# Patient Record
Sex: Female | Born: 1960
Health system: Southern US, Community
[De-identification: ages and names within clinical notes are randomized; demographics above are authoritative.]

## PROBLEM LIST (undated history)

## (undated) DIAGNOSIS — R928 Other abnormal and inconclusive findings on diagnostic imaging of breast: Secondary | ICD-10-CM

## (undated) DIAGNOSIS — N83299 Other ovarian cyst, unspecified side: Secondary | ICD-10-CM

## (undated) HISTORY — DX: Other abnormal and inconclusive findings on diagnostic imaging of breast: R92.8

## (undated) HISTORY — DX: Other ovarian cyst, unspecified side: N83.299

## (undated) HISTORY — PX: ABDOMINAL HYSTERECTOMY: SHX81

## (undated) HISTORY — PX: APPENDECTOMY: SHX54

## (undated) HISTORY — PX: COLON SURGERY: SHX602

---

## 1998-09-05 ENCOUNTER — Encounter: Payer: Self-pay | Admitting: *Deleted

## 1998-09-05 ENCOUNTER — Ambulatory Visit (HOSPITAL_COMMUNITY): Admission: RE | Admit: 1998-09-05 | Discharge: 1998-09-05 | Payer: Self-pay | Admitting: *Deleted

## 1998-10-23 ENCOUNTER — Other Ambulatory Visit: Admission: RE | Admit: 1998-10-23 | Discharge: 1998-10-23 | Payer: Self-pay | Admitting: *Deleted

## 1999-10-29 ENCOUNTER — Other Ambulatory Visit: Admission: RE | Admit: 1999-10-29 | Discharge: 1999-10-29 | Payer: Self-pay | Admitting: *Deleted

## 2003-11-11 HISTORY — PX: PARTIAL COLECTOMY: SHX5273

## 2003-11-11 HISTORY — PX: DILATION AND CURETTAGE OF UTERUS: SHX78

## 2004-11-10 HISTORY — PX: BREAST ENHANCEMENT SURGERY: SHX7

## 2004-11-10 HISTORY — PX: AUGMENTATION MAMMAPLASTY: SUR837

## 2008-11-10 DIAGNOSIS — N83299 Other ovarian cyst, unspecified side: Secondary | ICD-10-CM

## 2008-11-10 HISTORY — DX: Other ovarian cyst, unspecified side: N83.299

## 2009-07-20 LAB — HM PAP SMEAR: HM Pap smear: NORMAL

## 2009-11-10 HISTORY — PX: SCAR REVISION: SHX5285

## 2011-03-13 ENCOUNTER — Other Ambulatory Visit: Payer: Self-pay | Admitting: Family Medicine

## 2011-07-31 ENCOUNTER — Encounter: Payer: Self-pay | Admitting: Internal Medicine

## 2011-07-31 ENCOUNTER — Ambulatory Visit (INDEPENDENT_AMBULATORY_CARE_PROVIDER_SITE_OTHER): Payer: PRIVATE HEALTH INSURANCE | Admitting: Internal Medicine

## 2011-07-31 VITALS — BP 136/81 | HR 77 | Temp 98.2°F | Resp 16 | Ht 62.0 in | Wt 117.5 lb

## 2011-07-31 DIAGNOSIS — F32A Depression, unspecified: Secondary | ICD-10-CM | POA: Insufficient documentation

## 2011-07-31 DIAGNOSIS — F329 Major depressive disorder, single episode, unspecified: Secondary | ICD-10-CM

## 2011-07-31 DIAGNOSIS — R5381 Other malaise: Secondary | ICD-10-CM

## 2011-07-31 DIAGNOSIS — R5383 Other fatigue: Secondary | ICD-10-CM

## 2011-07-31 MED ORDER — CITALOPRAM HYDROBROMIDE 10 MG PO TABS: 10.0000 mg | ORAL_TABLET | Freq: Every day | ORAL | Status: DC

## 2011-07-31 NOTE — Progress Notes (Signed)
Subjective:    Patient ID: Mikayla Wagner, female    DOB: [birth year], 50 y.o.   MRN: [id number]  HPI Mikayla Wagner is a 50 year old female who presents to establish care. Her primary concern today is increased fatigue, anxiety, and depression which she has noted ever since having her hysterectomy. She reports having an emergent hysterectomy because of her rapidly growing ovarian cyst. The cyst was removed as was part of her omentum. After that time she reports generally not feeling like herself. She was started on estrogen supplementation, but has had no improvement with this. She continues to have fatigue and reports decreased interest in her usual activities. She also reports increased agitation and being quick to anger. She reports some weight gain. She denies any fever, chills, hot flashes. She does have pain during intercourse, associated with vaginal dryness.  Outpatient Encounter Prescriptions as of 07/31/2011  Medication Sig Dispense Refill  . ALPRAZolam (XANAX) 1 MG tablet Take 1 mg by mouth at bedtime as needed.        [name] estrogens conjugated, synthetic B, (ENJUVIA) 1.25 MG tablet Take 1.25 mg by mouth daily. Takes 1/2 tablet       . citalopram (CELEXA) 10 MG tablet Take 1 tablet (10 mg total) by mouth daily.  30 tablet  2    Review of Systems  Constitutional: Positive for fatigue. Negative for fever, chills, appetite change and unexpected weight change.  HENT: Negative for ear pain, congestion, sore throat, trouble swallowing, neck pain, voice change and sinus pressure.   Eyes: Negative for visual disturbance.  Respiratory: Negative for cough, shortness of breath, wheezing and stridor.   Cardiovascular: Negative for chest pain, palpitations and leg swelling.  Gastrointestinal: Negative for nausea, vomiting, abdominal pain, diarrhea, constipation, blood in stool, abdominal distention and anal bleeding.  Genitourinary: Positive for dyspareunia. Negative for dysuria and flank pain.    Musculoskeletal: Negative for myalgias, arthralgias and gait problem.  Skin: Negative for color change and rash.  Neurological: Negative for dizziness and headaches.  Hematological: Negative for adenopathy. Does not bruise/bleed easily.  Psychiatric/Behavioral: Positive for dysphoric mood and agitation. Negative for suicidal ideas and sleep disturbance. The patient is nervous/anxious.    BP 136/81  Pulse 77  Temp(Src) 98.2 F (36.8 C) (Oral)  Resp 16  Ht 5\' 2"  (1.575 m)  Wt 117 lb 8 oz (53.298 kg)  BMI 21.49 kg/m2  SpO2 100%     Objective:   Physical Exam  Constitutional: She is oriented to person, place, and time. She appears well-developed and well-nourished. No distress.  HENT:  Head: Normocephalic and atraumatic.  Right Ear: External ear normal.  Left Ear: External ear normal.  Nose: Nose normal.  Mouth/Throat: Oropharynx is clear and moist. No oropharyngeal exudate.  Eyes: Conjunctivae are normal. Pupils are equal, round, and reactive to light. Right eye exhibits no discharge. Left eye exhibits no discharge. No scleral icterus.  Neck: Normal range of motion. Neck supple. No tracheal deviation present. No thyromegaly present.  Cardiovascular: Normal rate, regular rhythm, normal heart sounds and intact distal pulses.  Exam reveals no gallop and no friction rub.   No murmur heard. Pulmonary/Chest: Effort normal and breath sounds normal. No respiratory distress. She has no wheezes. She has no rales. She exhibits no tenderness.  Abdominal: Soft. Bowel sounds are normal. She exhibits no distension and no mass. There is no tenderness. There is no rebound and no guarding.  Musculoskeletal: Normal range of motion. She exhibits no edema and no tenderness.  Lymphadenopathy:    She has no cervical adenopathy.  Neurological: She is alert and oriented to person, place, and time. No cranial nerve deficit. She exhibits normal muscle tone. Coordination normal.  Skin: Skin is warm and dry.  No rash noted. She is not diaphoretic. No erythema. No pallor.  Psychiatric: She has a normal mood and affect. Her behavior is normal. Judgment and thought content normal.          Assessment & Plan:  1. Fatigue - patient with fatigue ever since having a hysterectomy. Review of labs obtained in May of 2012 show normal blood counts, kidney function, and liver function. I suspect that her fatigue may be related to estrogen deficiency. However, she has been taking estrogen supplementation. Question if she might benefit from an alternative form of estrogen supplementation either Premarin or bioidentical hormones. We discussed there is no medical data to support the use of bioidentical hormones, however for patients who has had difficulty with traditional preparations sometimes there is a benefit. On her labs from May in 2012 her thyroid function was normal, however we will plan to repeat it with labs today. We'll also check a B12 level given her history of bowel resection. She will followup in one month.  2. Depression - patient reports symptoms consistent with depression. We discussed starting Celexa to see if any improvement. Will start Celexa 10 mg daily. She will followup in one month.

## 2011-08-01 ENCOUNTER — Other Ambulatory Visit: Payer: Self-pay | Admitting: Internal Medicine

## 2011-08-18 ENCOUNTER — Telehealth: Payer: Self-pay | Admitting: Internal Medicine

## 2011-08-18 NOTE — Telephone Encounter (Signed)
Can you fine out where labs done? I don't see any in system. If wants to discuss meds, then needs visit.

## 2011-08-18 NOTE — Telephone Encounter (Signed)
OK. Let's get notes from hospital and have her change celexa to morning to see if any improvement.

## 2011-08-18 NOTE — Telephone Encounter (Signed)
Wanting lab results patient is at home. Wants to discuss new medication.

## 2011-08-18 NOTE — Telephone Encounter (Signed)
Patient says that these labs were ordered at her new patient visit on 9-20 and she had them done at the hospital. I will call and see if I can have them sent over to [place]. Also patient says that she was told to call if the celexa wasn't working for her. She says that it is helping, but seems to be causing abdominal cramps. She has been taking at night before bedtime. She is asking if she should start taking it in the mornings. Please advise.

## 2011-08-18 NOTE — Telephone Encounter (Signed)
I sent request to [facility] for recent lab results. Patient notified to try taking celexa in the am and call if there is no improvement.

## 2011-08-21 ENCOUNTER — Telehealth: Payer: Self-pay | Admitting: Internal Medicine

## 2011-08-21 NOTE — Telephone Encounter (Signed)
Patients wants her lab results from the hospital . Lab work was faxed over by the hospital according to the patient.

## 2011-08-21 NOTE — Telephone Encounter (Signed)
Pam, please look into this. I think I sent you a message on this.

## 2011-08-21 NOTE — Telephone Encounter (Signed)
Pam, please look into this

## 2011-08-22 ENCOUNTER — Telehealth: Payer: Self-pay | Admitting: Internal Medicine

## 2011-08-22 NOTE — Telephone Encounter (Signed)
Faxed over to medical records to fax [place] her labs.

## 2011-08-27 ENCOUNTER — Telehealth: Payer: Self-pay | Admitting: Internal Medicine

## 2011-08-27 NOTE — Telephone Encounter (Signed)
Left patient VM to return my call.  

## 2011-08-27 NOTE — Telephone Encounter (Signed)
OPened in error.

## 2011-08-27 NOTE — Telephone Encounter (Signed)
Message copied by Virgina Evener on Wed Aug 27, 2011 12:50 PM ------      Message from: Ronna Polio A      Created: Fri Aug 22, 2011 10:11 AM      Regarding: Labs       I was able to get labs from the hospital. Blood counts and thyroid function were normal. B12 was normal. Hormone levels were normal except testosterone which was on higher side of normal. Vit D was slightly low, and I would recommend 1000units of vit d daily.  We can review in detail at her visit.

## 2011-09-01 ENCOUNTER — Encounter: Payer: Self-pay | Admitting: Internal Medicine

## 2011-09-01 ENCOUNTER — Ambulatory Visit (INDEPENDENT_AMBULATORY_CARE_PROVIDER_SITE_OTHER): Payer: PRIVATE HEALTH INSURANCE | Admitting: Internal Medicine

## 2011-09-01 DIAGNOSIS — F329 Major depressive disorder, single episode, unspecified: Secondary | ICD-10-CM

## 2011-09-01 DIAGNOSIS — F32A Depression, unspecified: Secondary | ICD-10-CM

## 2011-09-01 DIAGNOSIS — G4700 Insomnia, unspecified: Secondary | ICD-10-CM

## 2011-09-01 MED ORDER — ERGOCALCIFEROL 1.25 MG (50000 UT) PO CAPS: 50000.0000 [IU] | ORAL_CAPSULE | ORAL | Status: AC

## 2011-09-01 MED ORDER — CITALOPRAM HYDROBROMIDE 20 MG PO TABS: 20.0000 mg | ORAL_TABLET | Freq: Every day | ORAL | Status: DC

## 2011-09-01 MED ORDER — ALPRAZOLAM 1 MG PO TABS: 1.0000 mg | ORAL_TABLET | Freq: Every evening | ORAL | Status: DC | PRN

## 2011-09-01 NOTE — Progress Notes (Signed)
  Subjective:    Patient ID: Mikayla Wagner, female    DOB: Dec 25, 1960, 50 y.o.   MRN: [id number]  HPI Ms. Mikayla Wagner is a 50 year old female with a history of depression and recent fatigue who presents for followup. At her last visit she was started on Celexa. She reports initially she had some improvement in her depressive symptoms, however then symptoms recurred.  She also has severe fatigue. Recent lab work was unremarkable. She had normal blood counts, normal kidney and liver function, normal thyroid function, normal B12 level. Her vitamin D was slightly low. Hormone levels were remarkable for elevated testosterone, however patient is using topical testosterone preparation. Estrogen level was normal. Progesterone level was slightly low.   Outpatient Encounter Prescriptions as of 09/01/2011  Medication Sig Dispense Refill  . ALPRAZolam (XANAX) 1 MG tablet Take 1 tablet (1 mg total) by mouth at bedtime as needed.  30 tablet  4  . estrogens conjugated, synthetic B, (ENJUVIA) 1.25 MG tablet Take 1.25 mg by mouth daily. Takes 1/2 tablet       . ibuprofen (ADVIL,MOTRIN) 800 MG tablet Take 800 mg by mouth every 8 (eight) hours as needed.        [name] DISCONTD: citalopram (CELEXA) 10 MG tablet Take 1 tablet (10 mg total) by mouth daily.  30 tablet  2    Review of Systems  Constitutional: Positive for fatigue. Negative for fever and chills.  Psychiatric/Behavioral: Positive for dysphoric mood. The patient is nervous/anxious.    BP 128/86  Pulse 85  Temp(Src) 98.5 F (36.9 C) (Oral)  Resp 16  Ht 5\' 2"  (1.575 m)  Wt 117 lb 4 oz (53.184 kg)  BMI 21.45 kg/m2  SpO2 97%     Objective:   Physical Exam  Constitutional: She appears well-developed and well-nourished.  HENT:  Head: Normocephalic.  Pulmonary/Chest: Effort normal.  Psychiatric: Her speech is normal. Cognition and memory are normal. She exhibits a depressed mood.          Assessment & Plan:  1. Depression - symptoms initially  improved with Celexa, then recurred. Will try increasing dose of Celexa to 20 mg daily. Patient will followup in one month. Encouraged her to consider counseling, however she would prefer to defer this at this time.  2. Fatigue -likely multifactorial. I suspect that depression is playing a major role. I don't think that vitamin D deficiency is significant enough to be playing a role, however we will supplement this to see if any improvement. It is possible that hormone imbalance is playing a role, and she has followup with pharmacist for evaluation for hormone compounding. I have also encouraged her to start a multivitamin with iron as MCV on low end of normal which may reflect iron deficiency. We'll plan to followup in one month. She will have CBC, Ferritin, and Vit D repeated prior to that visit.

## 2011-09-01 NOTE — Patient Instructions (Signed)
1. Increase celexa to 20mg  daily. 2. Add Vit D (Drisdol) 50000units weekly x4 weeks 3. Follow up with Annice Pih 4. Add multivitamin with iron 5. Continue xanax as needed for sleep 6. Labs in 3weeks 7. Follow up in 4 weeks.

## 2011-09-10 ENCOUNTER — Telehealth: Payer: Self-pay | Admitting: Internal Medicine

## 2011-09-10 NOTE — Telephone Encounter (Signed)
Pam, Didn't you call her with these lab reports?Can you call her again and see what she needs?

## 2011-09-10 NOTE — Telephone Encounter (Signed)
Patient can't get her lab work from [facility] . They told her the physician has to request it, she is wanting her test results from 9.21.12.

## 2011-09-12 NOTE — Telephone Encounter (Signed)
MD emailed patient. Email's printed and sent to be scanned.

## 2011-09-17 ENCOUNTER — Encounter: Payer: Self-pay | Admitting: Internal Medicine

## 2011-10-06 ENCOUNTER — Ambulatory Visit: Payer: PRIVATE HEALTH INSURANCE | Admitting: Internal Medicine

## 2011-10-07 ENCOUNTER — Telehealth: Payer: Self-pay | Admitting: *Deleted

## 2011-10-07 MED ORDER — ESTRIOL POWD: Status: DC

## 2011-10-07 MED ORDER — PROGESTERONE POWD: Status: DC

## 2011-10-07 NOTE — Telephone Encounter (Signed)
[facility] @ [place] faxed MD results of hormone testing. They suggested RX for cream and vaginal tablet, ok per MD, order faxed.

## 2011-11-06 ENCOUNTER — Ambulatory Visit: Payer: PRIVATE HEALTH INSURANCE | Admitting: Internal Medicine

## 2012-02-16 ENCOUNTER — Other Ambulatory Visit: Payer: Self-pay | Admitting: Internal Medicine

## 2012-02-16 ENCOUNTER — Ambulatory Visit (INDEPENDENT_AMBULATORY_CARE_PROVIDER_SITE_OTHER): Payer: PRIVATE HEALTH INSURANCE | Admitting: Internal Medicine

## 2012-02-16 ENCOUNTER — Encounter: Payer: Self-pay | Admitting: Internal Medicine

## 2012-02-16 VITALS — BP 118/76 | HR 66 | Temp 98.6°F | Wt 127.0 lb

## 2012-02-16 DIAGNOSIS — R140 Abdominal distension (gaseous): Secondary | ICD-10-CM | POA: Insufficient documentation

## 2012-02-16 DIAGNOSIS — R5383 Other fatigue: Secondary | ICD-10-CM

## 2012-02-16 DIAGNOSIS — R5381 Other malaise: Secondary | ICD-10-CM

## 2012-02-16 DIAGNOSIS — R141 Gas pain: Secondary | ICD-10-CM

## 2012-02-16 LAB — COMPREHENSIVE METABOLIC PANEL
Alkaline Phosphatase: 59 U/L (ref 50–136)
Calcium, Total: 8.9 mg/dL (ref 8.5–10.1)
Co2: 28 mmol/L (ref 21–32)
EGFR (Non-African Amer.): >60
Osmolality: 276 (ref 275–301)
Potassium: 4.2 mmol/L (ref 3.5–5.1)
SGPT (ALT): 25 U/L
Sodium: 139 mmol/L (ref 136–145)
Total Protein: 7.5 g/dL (ref 6.4–8.2)

## 2012-02-16 LAB — CBC WITH DIFFERENTIAL/PLATELET
Basophil #: 0.1 10*3/uL (ref 0.0–0.1)
Eosinophil #: 0.2 10*3/uL (ref 0.0–0.7)
Eosinophil %: 1.6 %
HCT: 43.5 % (ref 35.0–47.0)
Lymphocyte #: 1.5 10*3/uL (ref 1.0–3.6)
Lymphocyte %: 15.3 %
MCH: 28.1 pg (ref 26.0–34.0)
MCV: 86 fL (ref 80–100)
Monocyte #: 0.4 10*3/uL (ref 0.0–0.7)
Monocyte %: 4 %
Neutrophil #: 7.6 10*3/uL — ABNORMAL HIGH (ref 1.4–6.5)
Neutrophil %: 78.5 %
Platelet: 305 10*3/uL (ref 150–440)
RBC: 5.03 10*6/uL (ref 3.80–5.20)
RDW: 13.1 % (ref 11.5–14.5)

## 2012-02-16 LAB — TSH: Thyroid Stimulating Horm: 1.67 u[IU]/mL

## 2012-02-16 NOTE — Progress Notes (Signed)
Subjective:    Patient ID: Mikayla Wagner, female    DOB: 1961/05/03, 51 y.o.   MRN: [id number]  HPI 51 year old female with history of depression and menopausal disorder presents for followup. He is concerned today about progressive fatigue. She noted fatigue at her last visit and lab work including CBC and thyroid function were normal. We suspected that the fatigue might be related to hot flashes and menopause and she started on hormone replacement. She reports no improvement with this. She continues to have hot flashes at night which impair her sleep. She is also noted some abdominal distention. She notes that she has gained approximately 6-7 pounds since her last visit. She denies any abdominal pain, nausea, vomiting, diarrhea, constipation, or change in bowel habits. She reports that she does not always follow a healthy diet and she does not exercise regularly.  She is concerned about the fatigue and weight gain and questions whether stimulant such as Vyvanse or phentermine might be helpful for her. She has never been diagnosed with ADD.  Outpatient Encounter Prescriptions as of 02/16/2012  Medication Sig Dispense Refill  . ALPRAZolam (XANAX) 1 MG tablet Take 1 tablet (1 mg total) by mouth at bedtime as needed.  30 tablet  4  . ergocalciferol (DRISDOL) 50000 UNITS capsule Take 1 capsule (50,000 Units total) by mouth once a week.  4 capsule  3  . Estriol POWD 1 mg vaginal tablet w/Testosterone 1 mg, USE one PV at bed time two times a week  15 Bottle  11  . estrogens conjugated, synthetic B, (ENJUVIA) 1.25 MG tablet Take 1.25 mg by mouth daily. Takes 1/2 tablet       . ibuprofen (ADVIL,MOTRIN) 800 MG tablet Take 800 mg by mouth every 8 (eight) hours as needed.        . Progesterone POWD 40 mg/mL Apply 1/2 mL twice daily 25 days a month  30 Bottle  11  . citalopram (CELEXA) 20 MG tablet Take 1 tablet (20 mg total) by mouth daily.  30 tablet  2    Review of Systems  Constitutional: Positive for  diaphoresis and fatigue. Negative for fever, chills, appetite change and unexpected weight change.  HENT: Negative for ear pain, congestion, sore throat, trouble swallowing, neck pain, voice change and sinus pressure.   Eyes: Negative for visual disturbance.  Respiratory: Negative for cough, shortness of breath, wheezing and stridor.   Cardiovascular: Negative for chest pain, palpitations and leg swelling.  Gastrointestinal: Positive for abdominal distention. Negative for nausea, vomiting, abdominal pain, diarrhea, constipation, blood in stool and anal bleeding.  Genitourinary: Negative for dysuria and flank pain.  Musculoskeletal: Negative for myalgias, arthralgias and gait problem.  Skin: Negative for color change and rash.  Neurological: Negative for dizziness and headaches.  Hematological: Negative for adenopathy. Does not bruise/bleed easily.  Psychiatric/Behavioral: Positive for sleep disturbance. Negative for suicidal ideas and dysphoric mood. The patient is not nervous/anxious.    BP 118/76  Pulse 66  Temp(Src) 98.6 F (37 C) (Oral)  Wt 127 lb (57.607 kg)  SpO2 99%     Objective:   Physical Exam  Constitutional: She is oriented to person, place, and time. She appears well-developed and well-nourished. No distress.  HENT:  Head: Normocephalic and atraumatic.  Right Ear: External ear normal.  Left Ear: External ear normal.  Nose: Nose normal.  Mouth/Throat: Oropharynx is clear and moist. No oropharyngeal exudate.  Eyes: Conjunctivae are normal. Pupils are equal, round, and reactive to light. Right eye  exhibits no discharge. Left eye exhibits no discharge. No scleral icterus.  Neck: Normal range of motion. Neck supple. No tracheal deviation present. No thyromegaly present.  Cardiovascular: Normal rate, regular rhythm, normal heart sounds and intact distal pulses.  Exam reveals no gallop and no friction rub.   No murmur heard. Pulmonary/Chest: Effort normal and breath sounds  normal. No respiratory distress. She has no wheezes. She has no rales. She exhibits no tenderness.  Abdominal: Soft. Bowel sounds are normal. She exhibits no distension. There is no tenderness.  Musculoskeletal: Normal range of motion. She exhibits no edema and no tenderness.  Lymphadenopathy:    She has no cervical adenopathy.  Neurological: She is alert and oriented to person, place, and time. No cranial nerve deficit. She exhibits normal muscle tone. Coordination normal.  Skin: Skin is warm and dry. No rash noted. She is not diaphoretic. No erythema. No pallor.  Psychiatric: She has a normal mood and affect. Her behavior is normal. Judgment and thought content normal.          Assessment & Plan:

## 2012-02-16 NOTE — Assessment & Plan Note (Addendum)
Pt continues to have fatigue. She questions possible use of medication such as Vyvanse or Phentermine, but we discussed that these medications are not indicated as she has no h/o ADHD or obesity. Suspect fatigue related to ongoing hot flashes at night, limiting sleep. Will recheck TSH, Free T4 and T3. Will check CBC. Follow up 1 month.

## 2012-02-16 NOTE — Assessment & Plan Note (Signed)
Pt c/o abdominal girth increase and 6-7lb weight gain. Exam is normal. Suspect weight gain from increased caloric intake and lack of exercise, however given pt h/o ovarian cyst removal will get ultrasound abdomen and pelvis.

## 2012-02-17 ENCOUNTER — Telehealth: Payer: Self-pay | Admitting: Internal Medicine

## 2012-02-17 NOTE — Telephone Encounter (Signed)
Estradiol <5.1 Progesterone 0.1

## 2012-02-19 ENCOUNTER — Other Ambulatory Visit: Payer: Self-pay | Admitting: Internal Medicine

## 2012-02-19 DIAGNOSIS — R141 Gas pain: Secondary | ICD-10-CM

## 2012-02-20 ENCOUNTER — Encounter: Payer: Self-pay | Admitting: Internal Medicine

## 2012-02-20 ENCOUNTER — Ambulatory Visit: Payer: Self-pay | Admitting: Internal Medicine

## 2012-02-23 ENCOUNTER — Telehealth: Payer: Self-pay | Admitting: Internal Medicine

## 2012-02-23 NOTE — Telephone Encounter (Signed)
I called patient to give her the appointment for her US Pelvis and she states that she has already had this done.  She stated "maybe one day we would get it together here and know what is going on". I tried to explain to patient that I was sorry I was working the referral wq and yes I had noticed that she had an US Abdomen.  She said whatever good bye and hung up.

## 2012-02-24 ENCOUNTER — Telehealth: Payer: Self-pay | Admitting: Internal Medicine

## 2012-02-24 NOTE — Telephone Encounter (Signed)
Pelvic US was normal 

## 2012-02-25 ENCOUNTER — Encounter: Payer: Self-pay | Admitting: Internal Medicine

## 2012-02-26 ENCOUNTER — Encounter: Payer: Self-pay | Admitting: Internal Medicine

## 2012-03-03 ENCOUNTER — Encounter: Payer: Self-pay | Admitting: Internal Medicine

## 2012-05-07 ENCOUNTER — Other Ambulatory Visit: Payer: Self-pay | Admitting: *Deleted

## 2012-05-07 DIAGNOSIS — G4700 Insomnia, unspecified: Secondary | ICD-10-CM

## 2012-05-07 MED ORDER — ALPRAZOLAM 1 MG PO TABS: 1.0000 mg | ORAL_TABLET | Freq: Every evening | ORAL | Status: DC | PRN

## 2012-05-07 NOTE — Telephone Encounter (Signed)
Rx called to [facility] pharmacy. 

## 2012-05-16 LAB — HM PAP SMEAR

## 2012-06-17 ENCOUNTER — Encounter: Payer: Self-pay | Admitting: Internal Medicine

## 2012-06-17 ENCOUNTER — Other Ambulatory Visit (HOSPITAL_COMMUNITY)
Admission: RE | Admit: 2012-06-17 | Discharge: 2012-06-17 | Disposition: A | Discharge status: Home / Self Care | Payer: PRIVATE HEALTH INSURANCE | Source: Ambulatory Visit | Attending: Internal Medicine | Admitting: Internal Medicine

## 2012-06-17 ENCOUNTER — Ambulatory Visit (INDEPENDENT_AMBULATORY_CARE_PROVIDER_SITE_OTHER): Payer: PRIVATE HEALTH INSURANCE | Admitting: Internal Medicine

## 2012-06-17 VITALS — BP 110/60 | HR 72 | Temp 98.7°F | Ht 62.0 in | Wt 130.5 lb

## 2012-06-17 DIAGNOSIS — R87810 Cervical high risk human papillomavirus (HPV) DNA test positive: Secondary | ICD-10-CM | POA: Insufficient documentation

## 2012-06-17 DIAGNOSIS — Z01419 Encounter for gynecological examination (general) (routine) without abnormal findings: Secondary | ICD-10-CM | POA: Insufficient documentation

## 2012-06-17 DIAGNOSIS — Z0000 Encounter for general adult medical examination without abnormal findings: Secondary | ICD-10-CM

## 2012-06-17 MED ORDER — CYANOCOBALAMIN 1000 MCG/ML IJ SOLN: 1000.0000 ug | Freq: Once | INTRAMUSCULAR | Status: DC

## 2012-06-17 NOTE — Progress Notes (Signed)
Subjective:    Patient ID: Mikayla Wagner, female    DOB: [birth year], 51 y.o.   MRN: [id number]  HPI 51 year old female presents for annual exam. She reports she is generally doing well. She is concerned about weight gain, and reports that she has been limiting her caloric intake and increasing physical activity in an effort to lose weight, particularly around her abdomen. She denies any other new concerns today.  Outpatient Encounter Prescriptions as of 06/17/2012  Medication Sig Dispense Refill  . ALPRAZolam (XANAX) 1 MG tablet Take 1 tablet (1 mg total) by mouth at bedtime as needed.  30 tablet  3  . ergocalciferol (DRISDOL) 50000 UNITS capsule Take 1 capsule (50,000 Units total) by mouth once a week.  4 capsule  3  . Estriol POWD 1 mg vaginal tablet w/Testosterone 1 mg, USE one PV at bed time two times a week  15 Bottle  11  . Progesterone POWD 40 mg/mL Apply 1/2 mL twice daily 25 days a month  30 Bottle  11  . ibuprofen (ADVIL,MOTRIN) 800 MG tablet Take 800 mg by mouth every 8 (eight) hours as needed.         BP 110/60  Pulse 72  Temp 98.7 F (37.1 C) (Oral)  Ht 5\' 2"  (1.575 m)  Wt 130 lb 8 oz (59.194 kg)  BMI 23.87 kg/m2  SpO2 97%  Review of Systems  Constitutional: Negative for fever, chills, appetite change, fatigue and unexpected weight change.  HENT: Negative for ear pain, congestion, sore throat, trouble swallowing, neck pain, voice change and sinus pressure.   Eyes: Negative for visual disturbance.  Respiratory: Negative for cough, shortness of breath, wheezing and stridor.   Cardiovascular: Negative for chest pain, palpitations and leg swelling.  Gastrointestinal: Negative for nausea, vomiting, abdominal pain, diarrhea, constipation, blood in stool, abdominal distention and anal bleeding.  Genitourinary: Negative for dysuria and flank pain.  Musculoskeletal: Negative for myalgias, arthralgias and gait problem.  Skin: Negative for color change and rash.  Neurological:  Negative for dizziness and headaches.  Hematological: Negative for adenopathy. Does not bruise/bleed easily.  Psychiatric/Behavioral: Negative for suicidal ideas, disturbed wake/sleep cycle and dysphoric mood. The patient is not nervous/anxious.        Objective:   Physical Exam  Constitutional: She is oriented to person, place, and time. She appears well-developed and well-nourished. No distress.  HENT:  Head: Normocephalic and atraumatic.  Right Ear: External ear normal.  Left Ear: External ear normal.  Nose: Nose normal.  Mouth/Throat: Oropharynx is clear and moist. No oropharyngeal exudate.  Eyes: Conjunctivae are normal. Pupils are equal, round, and reactive to light. Right eye exhibits no discharge. Left eye exhibits no discharge. No scleral icterus.  Neck: Normal range of motion. Neck supple. No tracheal deviation present. No thyromegaly present.  Cardiovascular: Normal rate, regular rhythm, normal heart sounds and intact distal pulses.  Exam reveals no gallop and no friction rub.   No murmur heard. Pulmonary/Chest: Effort normal and breath sounds normal. No respiratory distress. She has no wheezes. She has no rales. She exhibits no tenderness.       Implants bilateral  Abdominal: Soft. Bowel sounds are normal. She exhibits no distension and no mass. There is no tenderness. There is no rebound and no guarding.  Genitourinary: Rectum normal, vagina normal and uterus normal. No breast swelling, tenderness, discharge or bleeding. Pelvic exam was performed with patient supine. There is no rash, tenderness or lesion on the right labia. There is no  rash, tenderness or lesion on the left labia. Right adnexum displays no mass, no tenderness and no fullness. Left adnexum displays no mass, no tenderness and no fullness. No erythema or tenderness around the vagina. No vaginal discharge found.       Cervix and uterus surgically absent  Musculoskeletal: Normal range of motion. She exhibits no edema  and no tenderness.  Lymphadenopathy:    She has no cervical adenopathy.  Neurological: She is alert and oriented to person, place, and time. No cranial nerve deficit. She exhibits normal muscle tone. Coordination normal.  Skin: Skin is warm and dry. No rash noted. She is not diaphoretic. No erythema. No pallor.  Psychiatric: She has a normal mood and affect. Her behavior is normal. Judgment and thought content normal.          Assessment & Plan:

## 2012-06-17 NOTE — Addendum Note (Signed)
Addended by: Jobie Quaker on: 06/17/2012 02:29 PM   Modules accepted: Orders

## 2012-06-17 NOTE — Assessment & Plan Note (Signed)
General medical exam including breast and pelvic exam are normal today. Pap is pending. Mammogram has been ordered. Will check basic blood work including CBC, CMP, lipid profile with labs drawn at [facility].

## 2012-06-21 ENCOUNTER — Telehealth: Payer: Self-pay | Admitting: *Deleted

## 2012-06-21 NOTE — Telephone Encounter (Signed)
Patient called and left a message on voicemail for me to call her, called back on work number and left message on voicemail for her to return my call.  She had questions about testosterone labs and dexa scan.

## 2012-06-22 ENCOUNTER — Other Ambulatory Visit: Payer: Self-pay | Admitting: Internal Medicine

## 2012-06-22 DIAGNOSIS — M810 Age-related osteoporosis without current pathological fracture: Secondary | ICD-10-CM

## 2012-06-22 NOTE — Telephone Encounter (Signed)
Order for Dexa faxed to [facility], order to add testosterone faxed to patient per her request.

## 2012-06-22 NOTE — Telephone Encounter (Signed)
Patient would like order for dexa scan faxed to [facility] and she also wants a testosterone level added to her other lab work.  Forms given to Dr. Dan Humphreys to order dexa scan and to add testosterone level.

## 2012-06-23 ENCOUNTER — Other Ambulatory Visit: Payer: Self-pay | Admitting: Internal Medicine

## 2012-06-23 DIAGNOSIS — N870 Mild cervical dysplasia: Secondary | ICD-10-CM

## 2012-06-24 ENCOUNTER — Other Ambulatory Visit: Payer: Self-pay | Admitting: Internal Medicine

## 2012-06-24 ENCOUNTER — Telehealth: Payer: Self-pay | Admitting: Internal Medicine

## 2012-06-24 ENCOUNTER — Encounter: Payer: Self-pay | Admitting: Internal Medicine

## 2012-06-24 LAB — CBC WITH DIFFERENTIAL/PLATELET
Basophil #: 0.1 10*3/uL (ref 0.0–0.1)
Basophil %: 1 %
Eosinophil #: 0.3 10*3/uL (ref 0.0–0.7)
Eosinophil %: 4.6 %
HGB: 13 g/dL (ref 12.0–16.0)
MCH: 27.3 pg (ref 26.0–34.0)
MCHC: 32.1 g/dL (ref 32.0–36.0)
MCV: 85 fL (ref 80–100)
Monocyte #: 0.6 x10 3/mm (ref 0.2–0.9)
Neutrophil #: 4.8 10*3/uL (ref 1.4–6.5)
Neutrophil %: 65.2 %
RDW: 13.6 % (ref 11.5–14.5)

## 2012-06-24 LAB — LIPID PANEL
Cholesterol: 181 mg/dL (ref 0–200)
HDL Cholesterol: 76 mg/dL — ABNORMAL HIGH (ref 40–60)
Ldl Cholesterol, Calc: 93 mg/dL (ref 0–100)
Triglycerides: 58 mg/dL (ref 0–200)

## 2012-06-24 LAB — COMPREHENSIVE METABOLIC PANEL
Albumin: 3.6 g/dL (ref 3.4–5.0)
Alkaline Phosphatase: 65 U/L (ref 50–136)
Anion Gap: 9 (ref 7–16)
BUN: 13 mg/dL (ref 7–18)
Bilirubin,Total: 0.6 mg/dL (ref 0.2–1.0)
Chloride: 104 mmol/L (ref 98–107)
Creatinine: 0.58 mg/dL — ABNORMAL LOW (ref 0.60–1.30)
EGFR (African American): >60
EGFR (Non-African Amer.): >60
Glucose: 89 mg/dL (ref 65–99)
SGOT(AST): 16 U/L (ref 15–37)
SGPT (ALT): 19 U/L (ref 12–78)
Total Protein: 7 g/dL (ref 6.4–8.2)

## 2012-06-24 NOTE — Telephone Encounter (Signed)
Recent labs including blood counts, kidney and liver function were normal.

## 2012-06-30 ENCOUNTER — Encounter: Payer: Self-pay | Admitting: Internal Medicine

## 2012-07-01 ENCOUNTER — Encounter: Payer: Self-pay | Admitting: Internal Medicine

## 2012-07-02 ENCOUNTER — Encounter: Payer: Self-pay | Admitting: Internal Medicine

## 2012-07-09 ENCOUNTER — Encounter: Payer: Self-pay | Admitting: Internal Medicine

## 2012-07-09 ENCOUNTER — Other Ambulatory Visit: Payer: Self-pay | Admitting: Internal Medicine

## 2012-07-09 NOTE — Telephone Encounter (Signed)
Pt called stated that jackie @ [facility] is faxing rx for her hormone meds   Today is the 2nd time this was faxed over on Monday of this week Please fax back to [place] ASAP

## 2012-07-09 NOTE — Telephone Encounter (Signed)
Rx faxed to [facility] pharmacy.  Patient advised via message left on voicemail at work.

## 2012-07-20 ENCOUNTER — Ambulatory Visit: Payer: Self-pay | Admitting: Gynecologic Oncology

## 2012-08-10 ENCOUNTER — Ambulatory Visit: Payer: Self-pay | Admitting: Gynecologic Oncology

## 2012-08-30 DIAGNOSIS — E559 Vitamin D deficiency, unspecified: Secondary | ICD-10-CM | POA: Insufficient documentation

## 2012-09-08 ENCOUNTER — Encounter: Payer: Self-pay | Admitting: Internal Medicine

## 2012-09-08 ENCOUNTER — Other Ambulatory Visit: Payer: Self-pay | Admitting: Internal Medicine

## 2012-09-08 NOTE — Telephone Encounter (Signed)
Refill request for estradiol 0.5 mg tabs Sig: 1/2 to 1 tablet daily  Progesterone sr 50 mg  Sig: take 1-2 caps HS  Testosterone 2% in petrolatum  Sig: uud

## 2012-09-09 NOTE — Telephone Encounter (Signed)
I already signed these to be filled at [facility]. They were in fax pile.

## 2012-09-09 NOTE — Telephone Encounter (Signed)
Rx faxed to [facility] pharmacy 

## 2012-10-21 ENCOUNTER — Ambulatory Visit: Payer: Self-pay | Admitting: Internal Medicine

## 2012-10-25 ENCOUNTER — Telehealth: Payer: Self-pay | Admitting: Internal Medicine

## 2012-10-25 NOTE — Telephone Encounter (Signed)
My Chart message sent to pt. Your bone density test showed T-score -2.3. This is consistent with early weakening of the bones. We should set up a visit to discuss treatment options.

## 2012-11-01 ENCOUNTER — Ambulatory Visit: Payer: PRIVATE HEALTH INSURANCE | Admitting: Internal Medicine

## 2012-11-01 ENCOUNTER — Ambulatory Visit: Payer: Self-pay | Admitting: Internal Medicine

## 2012-11-04 ENCOUNTER — Telehealth: Payer: Self-pay | Admitting: Internal Medicine

## 2012-11-04 ENCOUNTER — Encounter: Payer: Self-pay | Admitting: Internal Medicine

## 2012-11-04 NOTE — Telephone Encounter (Signed)
Mammograms showed microcalcifications left breast.  Recommended compression views. Has this been set up?

## 2012-11-05 ENCOUNTER — Ambulatory Visit: Payer: Self-pay | Admitting: Internal Medicine

## 2012-11-05 ENCOUNTER — Ambulatory Visit: Payer: PRIVATE HEALTH INSURANCE | Admitting: Internal Medicine

## 2012-11-05 ENCOUNTER — Telehealth: Payer: Self-pay | Admitting: Internal Medicine

## 2012-11-05 DIAGNOSIS — R920 Mammographic microcalcification found on diagnostic imaging of breast: Secondary | ICD-10-CM

## 2012-11-05 NOTE — Telephone Encounter (Signed)
Mammogram BIRADS4 - microcalcifications left breast, will set up surgical eval

## 2012-11-05 NOTE — Telephone Encounter (Signed)
Yes, pt had completed on 12/27.

## 2012-11-08 ENCOUNTER — Encounter: Payer: Self-pay | Admitting: Internal Medicine

## 2012-11-08 ENCOUNTER — Telehealth: Payer: Self-pay | Admitting: Emergency Medicine

## 2012-11-08 ENCOUNTER — Telehealth: Payer: Self-pay | Admitting: Internal Medicine

## 2012-11-08 DIAGNOSIS — G4700 Insomnia, unspecified: Secondary | ICD-10-CM

## 2012-11-08 MED ORDER — ALPRAZOLAM 1 MG PO TABS: 1.0000 mg | ORAL_TABLET | Freq: Every evening | ORAL | Status: DC | PRN

## 2012-11-08 NOTE — Telephone Encounter (Signed)
Patient is wanting to know if we can get her xanax refilled. She is quite upset about having to go to Dr. Rutherford Nail office for her abnormal mammo. Could we please refill this and let her know when we do?

## 2012-11-08 NOTE — Telephone Encounter (Signed)
Ok to refill 

## 2012-11-08 NOTE — Telephone Encounter (Signed)
Patient calling to see if her prescription was faxed and when?  The pharmacy had to close early.   Per chart, it was refilled and sent with 3 refills.

## 2012-11-08 NOTE — Telephone Encounter (Signed)
I faxed it twice and ended up calling them Verbally.

## 2012-11-08 NOTE — Telephone Encounter (Signed)
Spoke with pt. She will follow up with Dr. Lemar Livings as discussed.

## 2012-11-09 NOTE — Telephone Encounter (Signed)
Pt/pharmacy notified

## 2012-11-16 ENCOUNTER — Encounter: Payer: Self-pay | Admitting: Internal Medicine

## 2012-11-18 ENCOUNTER — Encounter: Payer: Self-pay | Admitting: Internal Medicine

## 2012-11-19 ENCOUNTER — Ambulatory Visit: Payer: PRIVATE HEALTH INSURANCE | Admitting: Internal Medicine

## 2012-11-19 ENCOUNTER — Encounter: Payer: Self-pay | Admitting: Internal Medicine

## 2012-11-19 MED ORDER — ERGOCALCIFEROL 1.25 MG (50000 UT) PO CAPS: 50000.0000 [IU] | ORAL_CAPSULE | ORAL | Status: DC

## 2012-11-24 ENCOUNTER — Ambulatory Visit: Payer: Self-pay | Admitting: Internal Medicine

## 2012-11-26 ENCOUNTER — Encounter: Payer: Self-pay | Admitting: Internal Medicine

## 2012-12-09 ENCOUNTER — Encounter: Payer: Self-pay | Admitting: Internal Medicine

## 2012-12-09 ENCOUNTER — Ambulatory Visit (INDEPENDENT_AMBULATORY_CARE_PROVIDER_SITE_OTHER): Payer: 59 | Admitting: Internal Medicine

## 2012-12-09 VITALS — BP 134/88 | HR 106 | Temp 98.4°F | Ht 62.0 in | Wt 137.0 lb

## 2012-12-09 DIAGNOSIS — M8580 Other specified disorders of bone density and structure, unspecified site: Secondary | ICD-10-CM | POA: Insufficient documentation

## 2012-12-09 DIAGNOSIS — F419 Anxiety disorder, unspecified: Secondary | ICD-10-CM | POA: Insufficient documentation

## 2012-12-09 DIAGNOSIS — F411 Generalized anxiety disorder: Secondary | ICD-10-CM

## 2012-12-09 DIAGNOSIS — G4700 Insomnia, unspecified: Secondary | ICD-10-CM

## 2012-12-09 DIAGNOSIS — M899 Disorder of bone, unspecified: Secondary | ICD-10-CM

## 2012-12-09 DIAGNOSIS — N952 Postmenopausal atrophic vaginitis: Secondary | ICD-10-CM

## 2012-12-09 DIAGNOSIS — M949 Disorder of cartilage, unspecified: Secondary | ICD-10-CM

## 2012-12-09 DIAGNOSIS — R920 Mammographic microcalcification found on diagnostic imaging of breast: Secondary | ICD-10-CM

## 2012-12-09 DIAGNOSIS — R109 Unspecified abdominal pain: Secondary | ICD-10-CM

## 2012-12-09 MED ORDER — HYOSCYAMINE SULFATE 0.125 MG PO TABS: 0.1250 mg | ORAL_TABLET | ORAL | Status: DC | PRN

## 2012-12-09 MED ORDER — ALPRAZOLAM 1 MG PO TABS: 1.0000 mg | ORAL_TABLET | Freq: Two times a day (BID) | ORAL | Status: DC | PRN

## 2012-12-09 MED ORDER — OSPEMIFENE 60 MG PO TABS: 60.0000 | ORAL_TABLET | Freq: Every day | ORAL | Status: DC

## 2012-12-09 NOTE — Assessment & Plan Note (Signed)
Evaluated by Dr. Lemar Livings. Plan for repeat mammogram in 6 months.

## 2012-12-09 NOTE — Assessment & Plan Note (Signed)
Symptoms improved with use of alprazolam. Will continue.

## 2012-12-09 NOTE — Assessment & Plan Note (Signed)
Recent bone density testing showed T score of -2.3. Will recheck vitamin D level. Discussed options including bisphosphonates and Prolia. Patient reluctant to use bisphosphonates given history of acid reflux. Will look into insurance approval for Prolia. Encouraged weight bearing exercise. Repeat bone density 2015.

## 2012-12-09 NOTE — Assessment & Plan Note (Signed)
Symptoms markedly worse after stopping hormone replacement. Patient would like to start Osphena. Reviewed literature on this medication and potential risk of increasing growth of estrogen receptive breast tumors. Note that patient had recent abnormal mammogram with calcifications. Recommended holding off on use of medication and discussing this with her general surgeon. In the interim, could use silicone based lubricants to help with symptoms.

## 2012-12-09 NOTE — Assessment & Plan Note (Signed)
Long history of abdominal cramping after partial colectomy for diverticulitis. Will start Levsin to see if any improvement in symptoms. FMLA paperwork filled out today so that patient can take time off work as needed during severe episodes.

## 2012-12-09 NOTE — Progress Notes (Signed)
Subjective:    Patient ID: Mikayla Wagner, female    DOB: 26-Jul-1961, 52 y.o.   MRN: [id number]  HPI 52 year old female with history of diverticulitis status post partial colectomy, atrophic vaginitis, anxiety, recent abnormal mammogram presents for followup. In regards to history of diverticulitis and colectomy, she reports intermittent episodes of abdominal cramping and pain. These are often severe and prevent her from being able to perform activities of daily living. They're sometimes associated with watery diarrhea. She denies any blood in her stool. She denies any nausea or vomiting. She is not currently taking anything for cramping.  In regards to atrophic vaginitis, she recently discontinued use of hormone replacement therapy because of the abnormal mammogram. She reports that symptoms of vaginal dryness have worsened. She describes extreme pain with any intercourse. She occasionally has vaginal irritation and bleeding.  In regards to abnormal mammogram, she reports that decision was made with her surgeon to repeat mammogram in 6 months to evaluate microcalcifications.  In regards to anxiety, she reports symptoms have been markedly worse with recent abnormal mammogram and separation from her boyfriend. She has been using Xanax occasionally twice daily with some improvement.  Outpatient Encounter Prescriptions as of 12/09/2012  Medication Sig Dispense Refill  . ALPRAZolam (XANAX) 1 MG tablet Take 1 tablet (1 mg total) by mouth 2 (two) times daily as needed for sleep or anxiety.  60 tablet  3  . ergocalciferol (DRISDOL) 50000 UNITS capsule Take 1 capsule (50,000 Units total) by mouth once a week.  4 capsule  12  . ibuprofen (ADVIL,MOTRIN) 800 MG tablet Take 800 mg by mouth every 8 (eight) hours as needed.        . [DISCONTINUED] ALPRAZolam (XANAX) 1 MG tablet Take 1 tablet (1 mg total) by mouth at bedtime as needed for sleep or anxiety.  30 tablet  3    BP 134/88  Pulse 106  Temp 98.4 F  (36.9 C) (Oral)  Ht 5\' 2"  (1.575 m)  Wt 137 lb (62.143 kg)  BMI 25.06 kg/m2  SpO2 91%  Review of Systems  Constitutional: Negative for fever, chills, appetite change, fatigue and unexpected weight change.  HENT: Negative for ear pain, congestion, sore throat, trouble swallowing, neck pain, voice change and sinus pressure.   Eyes: Negative for visual disturbance.  Respiratory: Negative for cough, shortness of breath, wheezing and stridor.   Cardiovascular: Negative for chest pain, palpitations and leg swelling.  Gastrointestinal: Positive for abdominal pain. Negative for nausea, vomiting, diarrhea, constipation, blood in stool, abdominal distention and anal bleeding.  Genitourinary: Positive for vaginal pain and dyspareunia. Negative for dysuria and flank pain.  Musculoskeletal: Negative for myalgias, arthralgias and gait problem.  Skin: Negative for color change and rash.  Neurological: Negative for dizziness and headaches.  Hematological: Negative for adenopathy. Does not bruise/bleed easily.  Psychiatric/Behavioral: Negative for suicidal ideas, sleep disturbance and dysphoric mood. The patient is not nervous/anxious.        Objective:   Physical Exam  Constitutional: She is oriented to person, place, and time. She appears well-developed and well-nourished. No distress.  HENT:  Head: Normocephalic and atraumatic.  Right Ear: External ear normal.  Left Ear: External ear normal.  Nose: Nose normal.  Mouth/Throat: Oropharynx is clear and moist. No oropharyngeal exudate.  Eyes: Conjunctivae normal are normal. Pupils are equal, round, and reactive to light. Right eye exhibits no discharge. Left eye exhibits no discharge. No scleral icterus.  Neck: Normal range of motion. Neck supple. No tracheal deviation  present. No thyromegaly present.  Cardiovascular: Normal rate, regular rhythm, normal heart sounds and intact distal pulses.  Exam reveals no gallop and no friction rub.   No murmur  heard. Pulmonary/Chest: Effort normal and breath sounds normal. No respiratory distress. She has no wheezes. She has no rales. She exhibits no tenderness.  Abdominal: Soft. Bowel sounds are normal. She exhibits distension (slight). She exhibits no mass. There is no tenderness. There is no guarding.  Musculoskeletal: Normal range of motion. She exhibits no edema and no tenderness.  Lymphadenopathy:    She has no cervical adenopathy.  Neurological: She is alert and oriented to person, place, and time. No cranial nerve deficit. She exhibits normal muscle tone. Coordination normal.  Skin: Skin is warm and dry. No rash noted. She is not diaphoretic. No erythema. No pallor.  Psychiatric: She has a normal mood and affect. Her behavior is normal. Judgment and thought content normal.          Assessment & Plan:

## 2012-12-21 ENCOUNTER — Other Ambulatory Visit: Payer: Self-pay | Admitting: Internal Medicine

## 2012-12-21 LAB — COMPREHENSIVE METABOLIC PANEL
Albumin: 3.8 g/dL (ref 3.4–5.0)
Alkaline Phosphatase: 74 U/L (ref 50–136)
Anion Gap: 5 — ABNORMAL LOW (ref 7–16)
Bilirubin,Total: 0.2 mg/dL (ref 0.2–1.0)
Co2: 30 mmol/L (ref 21–32)
Creatinine: 0.58 mg/dL — ABNORMAL LOW (ref 0.60–1.30)
EGFR (African American): >60
Glucose: 79 mg/dL (ref 65–99)
Osmolality: 283 (ref 275–301)
Potassium: 3.8 mmol/L (ref 3.5–5.1)
SGPT (ALT): 22 U/L (ref 12–78)
Total Protein: 7.2 g/dL (ref 6.4–8.2)

## 2012-12-21 LAB — CBC WITH DIFFERENTIAL/PLATELET
Basophil %: 0.4 %
HCT: 40.2 % (ref 35.0–47.0)
Lymphocyte #: 1.6 10*3/uL (ref 1.0–3.6)
Lymphocyte %: 22 %
MCHC: 32.7 g/dL (ref 32.0–36.0)
MCV: 85 fL (ref 80–100)
Monocyte #: 0.5 x10 3/mm (ref 0.2–0.9)
Monocyte %: 6.2 %
Neutrophil #: 4.8 10*3/uL (ref 1.4–6.5)
RBC: 4.73 10*6/uL (ref 3.80–5.20)
WBC: 7.3 10*3/uL (ref 3.6–11.0)

## 2012-12-21 LAB — LIPID PANEL: Ldl Cholesterol, Calc: 85 mg/dL (ref 0–100)

## 2012-12-21 LAB — TSH: Thyroid Stimulating Horm: 0.835 u[IU]/mL

## 2012-12-22 ENCOUNTER — Telehealth: Payer: Self-pay | Admitting: Internal Medicine

## 2012-12-22 NOTE — Telephone Encounter (Signed)
Labs from 2/11 show normal blood counts, kidney, liver function and thyroid function.

## 2012-12-22 NOTE — Telephone Encounter (Signed)
FYI

## 2012-12-22 NOTE — Telephone Encounter (Signed)
Lab results from [facility]

## 2012-12-25 ENCOUNTER — Other Ambulatory Visit: Payer: Self-pay

## 2012-12-27 NOTE — Telephone Encounter (Signed)
Left detailed message on patient answer machine informing her all labs were normal from [facility]

## 2013-01-13 ENCOUNTER — Encounter: Payer: Self-pay | Admitting: Internal Medicine

## 2013-01-17 ENCOUNTER — Telehealth: Payer: Self-pay | Admitting: Emergency Medicine

## 2013-01-17 ENCOUNTER — Ambulatory Visit: Payer: 59 | Admitting: Internal Medicine

## 2013-01-17 NOTE — Telephone Encounter (Signed)
The patient called and called her apt. She was wondering if she can get a shot for osteoporosis. She also was calling to see if we also could give her something to help her sleep? Can we please call her at [phone].

## 2013-01-28 ENCOUNTER — Telehealth: Payer: Self-pay | Admitting: Emergency Medicine

## 2013-01-28 NOTE — Telephone Encounter (Signed)
Patient was calling back to see if anyone had f/u on her osteoporosis shot?

## 2013-01-28 NOTE — Telephone Encounter (Signed)
Shannon - Can you look into this? 

## 2013-02-07 ENCOUNTER — Other Ambulatory Visit: Payer: Self-pay | Admitting: Internal Medicine

## 2013-02-07 DIAGNOSIS — R109 Unspecified abdominal pain: Secondary | ICD-10-CM

## 2013-02-07 MED ORDER — HYOSCYAMINE SULFATE 0.125 MG PO TABS: 0.1250 mg | ORAL_TABLET | ORAL | Status: DC | PRN

## 2013-03-02 ENCOUNTER — Telehealth: Payer: Self-pay | Admitting: Internal Medicine

## 2013-03-02 NOTE — Telephone Encounter (Signed)
Pt calling to check on appt for Prolia.  Asking if prior Berkley Harvey has been completed.  Lupita Leash calling [facility] to check on prior auth before scheduling injection.

## 2013-03-02 NOTE — Telephone Encounter (Signed)
Spoke with Mora Bellman re prior auth for pt Prolia.  Email sent to [facility] per her request with pt info.  Ruby will be out of office this afternoon but will check on this 4/24 a.m.  Waiting to hear from [facility] before scheduling injection.

## 2013-03-14 ENCOUNTER — Encounter: Payer: Self-pay | Admitting: *Deleted

## 2013-03-15 ENCOUNTER — Other Ambulatory Visit: Payer: Self-pay | Admitting: *Deleted

## 2013-03-15 DIAGNOSIS — R109 Unspecified abdominal pain: Secondary | ICD-10-CM

## 2013-03-15 MED ORDER — HYOSCYAMINE SULFATE 0.125 MG PO TABS: 0.1250 mg | ORAL_TABLET | ORAL | Status: DC | PRN

## 2013-03-21 ENCOUNTER — Telehealth: Payer: Self-pay | Admitting: Internal Medicine

## 2013-03-21 NOTE — Telephone Encounter (Signed)
See below Does pt need office visit or nurse visit.  Is the for the prolia shot if so do we have her prolia in Thanks [employer]    Appointment Request From: Inocencio Homes      With Provider: Wynona Dove, MD [-Primary Care Physician-]      Preferred Date Range: Any date 03/28/2013 or later      Preferred Times: Monday Afternoon, Tuesday Afternoon, Wednesday Afternoon, Thursday Afternoon, Friday Afternoon      Reason for visit: Office Visit      Comments:   Supposed to be getting that osteoporosis shot?? Need a 15 min visit to go over stuff please   Thanks!

## 2013-03-22 ENCOUNTER — Telehealth: Payer: Self-pay | Admitting: Internal Medicine

## 2013-03-22 NOTE — Telephone Encounter (Signed)
See below my chart message Has shot been ordered  Does she need office visit or nurse visit  Appointment Request From: Inocencio Homes  With Provider: Wynona Dove, MD [-Primary Care Physician-]  Preferred Date Range: Any date 03/28/2013 or later  Preferred Times: Monday Afternoon, Tuesday Afternoon, Wednesday Afternoon, Thursday Afternoon, Friday Afternoon  Reason for visit: Office Visit Comments: Supposed to be getting that osteoporosis shot??  Need a 15 min visit to go over stuff please Thanks!

## 2013-03-22 NOTE — Telephone Encounter (Signed)
Lupita Leash was working on this.

## 2013-03-23 NOTE — Telephone Encounter (Signed)
Spoke with Ruby and she stated [employer] is requesting some more information to be faxed to them. Information printed and faxed.

## 2013-03-24 NOTE — Telephone Encounter (Signed)
Patient is aware that insurance company is still reviewing her information.

## 2013-03-24 NOTE — Telephone Encounter (Signed)
Left msg for patient to return my call. i want to advise her that we are in the process of getting the Prolia shot authorized from her insurance company.

## 2013-03-24 NOTE — Telephone Encounter (Signed)
Duplicate message, Please refer to previous encounter for more information

## 2013-04-11 ENCOUNTER — Encounter: Payer: Self-pay | Admitting: Internal Medicine

## 2013-05-06 ENCOUNTER — Encounter: Payer: Self-pay | Admitting: Internal Medicine

## 2013-05-11 ENCOUNTER — Ambulatory Visit: Payer: Self-pay | Admitting: General Surgery

## 2013-05-12 ENCOUNTER — Encounter: Payer: Self-pay | Admitting: General Surgery

## 2013-05-19 ENCOUNTER — Ambulatory Visit: Payer: Self-pay | Admitting: General Surgery

## 2013-05-20 ENCOUNTER — Telehealth: Payer: Self-pay | Admitting: General Surgery

## 2013-05-20 NOTE — Telephone Encounter (Signed)
The mammograms completed 05/11/2013 were reviewed and compared with her December 23 and 11/05/2012 study. No interval change in the small cluster of microcalcifications in the proximal aspect of the left breast.  The radiologist again rates these BI-RADS-4.  With no interval change, I have a low suspicion for malignancy. This information was relayed to the patient, as she is called away prior to her scheduled office visit yesterday.  We can still procedure the stereotactic biopsy if she desires, or we can continue close follow up with a repeat mammogram (bilateral) with left focal spot compression views in 6 months. At this time she is comfortable with careful observation.  She brought up the need for followup colonoscopy, her last study being completed in [place], [place] in 2010. By report this is a normal study. No family history of colon cancer. In spite of her history of perforated diverticulitis during pregnancy, I would not see a need for repeat colonoscopy in the near future.  At present we'll plan to get together in January 2015 for review of her mammograms. She will bring copies of her previous colonoscopy studies to that appointment.

## 2013-05-26 ENCOUNTER — Encounter: Payer: Self-pay | Admitting: Internal Medicine

## 2013-05-26 ENCOUNTER — Other Ambulatory Visit: Payer: Self-pay | Admitting: *Deleted

## 2013-05-26 DIAGNOSIS — G4700 Insomnia, unspecified: Secondary | ICD-10-CM

## 2013-05-26 MED ORDER — ALPRAZOLAM 1 MG PO TABS: 1.0000 mg | ORAL_TABLET | Freq: Two times a day (BID) | ORAL | Status: DC | PRN

## 2013-06-10 ENCOUNTER — Ambulatory Visit: Payer: 59 | Admitting: Internal Medicine

## 2013-06-15 ENCOUNTER — Ambulatory Visit: Payer: Self-pay | Admitting: General Surgery

## 2013-09-15 ENCOUNTER — Other Ambulatory Visit: Payer: Self-pay

## 2013-10-14 ENCOUNTER — Telehealth: Payer: Self-pay | Admitting: Emergency Medicine

## 2013-10-14 DIAGNOSIS — G4700 Insomnia, unspecified: Secondary | ICD-10-CM

## 2013-10-14 MED ORDER — ALPRAZOLAM 1 MG PO TABS: 1.0000 mg | ORAL_TABLET | Freq: Two times a day (BID) | ORAL | Status: DC | PRN

## 2013-10-14 NOTE — Telephone Encounter (Signed)
We can refill alprazolam for 1 month only, then needs follow up visit.

## 2013-10-14 NOTE — Telephone Encounter (Signed)
Patient called stating she only has two pills left. Can we please refill ALPRAZolam (XANAX) 1 MG tablet. Can we please refill this today?

## 2013-10-14 NOTE — Telephone Encounter (Signed)
Patient informed prescription was called to pharmacy for 1 month supply and she will need to schedule an appointment prior to anymore refills. Patient stated she will call some time next week to schedule this.

## 2013-10-19 ENCOUNTER — Telehealth: Payer: Self-pay | Admitting: Emergency Medicine

## 2013-10-19 ENCOUNTER — Encounter: Payer: Self-pay | Admitting: Internal Medicine

## 2013-10-19 DIAGNOSIS — R928 Other abnormal and inconclusive findings on diagnostic imaging of breast: Secondary | ICD-10-CM

## 2013-10-19 NOTE — Telephone Encounter (Signed)
Can you help pt schedule bilateral diagnostic mammogram.

## 2013-10-19 NOTE — Telephone Encounter (Signed)
Have you heard anything about her Prolia shot?

## 2013-10-19 NOTE — Telephone Encounter (Signed)
Patient called wondering about her injection for prolia. Pt states she has talked with Heather at [phone] ext [phone], Herbert Seta stated there is no request ever been filed for this pts injection.

## 2013-11-08 ENCOUNTER — Ambulatory Visit: Payer: 59 | Admitting: Internal Medicine

## 2013-11-14 ENCOUNTER — Ambulatory Visit: Payer: 59 | Admitting: Internal Medicine

## 2013-11-16 ENCOUNTER — Encounter: Payer: Self-pay | Admitting: *Deleted

## 2013-11-17 ENCOUNTER — Telehealth: Payer: Self-pay | Admitting: Emergency Medicine

## 2013-11-17 ENCOUNTER — Ambulatory Visit: Payer: 59 | Admitting: Internal Medicine

## 2013-11-17 ENCOUNTER — Other Ambulatory Visit: Payer: Self-pay | Admitting: Internal Medicine

## 2013-11-17 DIAGNOSIS — R109 Unspecified abdominal pain: Secondary | ICD-10-CM

## 2013-11-17 DIAGNOSIS — G4700 Insomnia, unspecified: Secondary | ICD-10-CM

## 2013-11-17 NOTE — Telephone Encounter (Signed)
No. We cannot give her a note for this week as we did not see her. She no-showed to two appointments. She can refill xanax at her visit.

## 2013-11-17 NOTE — Telephone Encounter (Signed)
Pt lvm stating she thinks she could wait until the next available apt., no need to work her in. She would like a doctors note from being out this week and 22 pills of xanax to get her to the next available apt.

## 2013-11-17 NOTE — Telephone Encounter (Signed)
Pt missed her 715a apt after r/s her 2pm apt 1/7 wit myself directly. She stated she called the answering service and was on hold "forever" with them. Patient is wanting to be seen today for her lab work and to see Dr. Gilford Rile. She needs a note for being out of work and a refill on her xanax. We do not have anything for this pt unless she see Raquel. Please advise

## 2013-11-17 NOTE — Telephone Encounter (Signed)
Please read below, patient has cancelled her last 2 appointments at the last minute.

## 2013-11-17 NOTE — Telephone Encounter (Signed)
She has no-showed TWICE at 7:15am visits that we could have used for acute patients. Without calling. I think that she can wait until my next available 70min follow up slot.

## 2013-11-18 MED ORDER — ALPRAZOLAM 1 MG PO TABS: 1.0000 mg | ORAL_TABLET | Freq: Two times a day (BID) | ORAL | Status: DC | PRN

## 2013-11-18 MED ORDER — HYOSCYAMINE SULFATE 0.125 MG PO TABS: 0.1250 mg | ORAL_TABLET | ORAL | Status: DC | PRN

## 2013-11-18 NOTE — Telephone Encounter (Signed)
Spoke with patient, she stated she is out of the Xanax. Could at least 10 pills be called in or does she have to wait until her appointment on the 19th?

## 2013-11-18 NOTE — Telephone Encounter (Signed)
OK. Refill #10

## 2013-11-21 ENCOUNTER — Encounter: Payer: Self-pay | Admitting: Internal Medicine

## 2013-11-28 ENCOUNTER — Ambulatory Visit: Payer: Self-pay | Admitting: General Surgery

## 2013-11-28 ENCOUNTER — Encounter: Payer: Self-pay | Admitting: Internal Medicine

## 2013-11-28 ENCOUNTER — Ambulatory Visit (INDEPENDENT_AMBULATORY_CARE_PROVIDER_SITE_OTHER): Payer: 59 | Admitting: Internal Medicine

## 2013-11-28 VITALS — BP 110/80 | HR 79 | Temp 98.0°F | Wt 135.0 lb

## 2013-11-28 DIAGNOSIS — R5381 Other malaise: Secondary | ICD-10-CM

## 2013-11-28 DIAGNOSIS — G4700 Insomnia, unspecified: Secondary | ICD-10-CM | POA: Insufficient documentation

## 2013-11-28 DIAGNOSIS — R5383 Other fatigue: Secondary | ICD-10-CM

## 2013-11-28 DIAGNOSIS — N959 Unspecified menopausal and perimenopausal disorder: Secondary | ICD-10-CM | POA: Insufficient documentation

## 2013-11-28 DIAGNOSIS — F4323 Adjustment disorder with mixed anxiety and depressed mood: Secondary | ICD-10-CM

## 2013-11-28 DIAGNOSIS — R109 Unspecified abdominal pain: Secondary | ICD-10-CM

## 2013-11-28 DIAGNOSIS — K589 Irritable bowel syndrome without diarrhea: Secondary | ICD-10-CM | POA: Insufficient documentation

## 2013-11-28 DIAGNOSIS — R920 Mammographic microcalcification found on diagnostic imaging of breast: Secondary | ICD-10-CM

## 2013-11-28 LAB — CBC WITH DIFFERENTIAL/PLATELET
BASOS ABS: 0 10*3/uL (ref 0.0–0.1)
Basophils Relative: 0.5 % (ref 0.0–3.0)
EOS ABS: 0.1 10*3/uL (ref 0.0–0.7)
EOS PCT: 1 % (ref 0.0–5.0)
HCT: 40.5 % (ref 36.0–46.0)
Hemoglobin: 13.4 g/dL (ref 12.0–15.0)
LYMPHS ABS: 1.6 10*3/uL (ref 0.7–4.0)
Lymphocytes Relative: 16.3 % (ref 12.0–46.0)
MCHC: 33 g/dL (ref 30.0–36.0)
MCV: 83.8 fl (ref 78.0–100.0)
MONO ABS: 0.3 10*3/uL (ref 0.1–1.0)
Monocytes Relative: 3.4 % (ref 3.0–12.0)
NEUTROS PCT: 78.8 % — AB (ref 43.0–77.0)
Neutro Abs: 7.8 10*3/uL — ABNORMAL HIGH (ref 1.4–7.7)
PLATELETS: 369 10*3/uL (ref 150.0–400.0)
RBC: 4.83 Mil/uL (ref 3.87–5.11)
RDW: 13.6 % (ref 11.5–14.6)
WBC: 9.9 10*3/uL (ref 4.5–10.5)

## 2013-11-28 LAB — COMPREHENSIVE METABOLIC PANEL
ALBUMIN: 4.1 g/dL (ref 3.5–5.2)
ALK PHOS: 58 U/L (ref 39–117)
ALT: 24 U/L (ref 0–35)
AST: 20 U/L (ref 0–37)
BUN: 15 mg/dL (ref 6–23)
CO2: 30 mEq/L (ref 19–32)
Calcium: 9.1 mg/dL (ref 8.4–10.5)
Chloride: 101 mEq/L (ref 96–112)
Creatinine, Ser: 0.5 mg/dL (ref 0.4–1.2)
GFR: 128.18 mL/min (ref >60.00)
GLUCOSE: 94 mg/dL (ref 70–99)
POTASSIUM: 4.3 meq/L (ref 3.5–5.1)
Sodium: 138 mEq/L (ref 135–145)
TOTAL PROTEIN: 7.3 g/dL (ref 6.0–8.3)
Total Bilirubin: 0.2 mg/dL — ABNORMAL LOW (ref 0.3–1.2)

## 2013-11-28 LAB — VITAMIN B12: VITAMIN B 12: 288 pg/mL (ref 211–911)

## 2013-11-28 LAB — T4, FREE: FREE T4: 0.77 ng/dL (ref 0.60–1.60)

## 2013-11-28 LAB — TSH: TSH: 0.9 u[IU]/mL (ref 0.35–5.50)

## 2013-11-28 MED ORDER — ALPRAZOLAM 1 MG PO TABS: 1.0000 mg | ORAL_TABLET | Freq: Two times a day (BID) | ORAL | Status: DC | PRN

## 2013-11-28 MED ORDER — PAROXETINE HCL 10 MG PO TABS: 10.0000 mg | ORAL_TABLET | Freq: Every day | ORAL | Status: DC

## 2013-11-28 MED ORDER — HYOSCYAMINE SULFATE 0.125 MG PO TABS: 0.1250 mg | ORAL_TABLET | ORAL | Status: DC | PRN

## 2013-11-28 MED ORDER — TRAZODONE HCL 50 MG PO TABS: 25.0000 mg | ORAL_TABLET | Freq: Every evening | ORAL | Status: DC | PRN

## 2013-11-28 NOTE — Assessment & Plan Note (Signed)
Reviewed recent mammograms today which showed microcalcifications. Plan for repeat mammogram this month and followup with her general surgeon.

## 2013-11-28 NOTE — Assessment & Plan Note (Signed)
Chronic anxiety, recently made worse with breakup with her boyfriend. Also discussed long-term issues with anxiety and depression after death of her parents. Recommended counseling. Will set this up. Will start low-dose Paxil 10 mg daily.

## 2013-11-28 NOTE — Assessment & Plan Note (Signed)
Chronic insomnia likely contributing to fatigue. Will try adding trazodone 50 mg at bedtime. If no improvement, patient will call or e-mail. Followup in 2-4 weeks or sooner as needed.

## 2013-11-28 NOTE — Progress Notes (Signed)
Subjective:    Patient ID: Mikayla Wagner, female    DOB: 1961/10/10, 53 y.o.   MRN: [id number]  HPI 53 year old female with history of anxiety presents for followup. She reports it has been a difficult time for her. She reports feeling fatigued. She has difficulty getting out of bed in the morning to go to work. She denies any focal symptoms such as shortness of breath, chest pain, change in appetite or bowel habits. She reports symptoms of anxiety and occasional depressed mood. She has gone through a difficult situation with a breakup. She has been taking alprazolam up to twice daily with some improvement in anxiety. However, she is having difficulty sleeping. She has difficulty both falling asleep and staying asleep. In the past, she tried Ambien but had some hallucinations with this medication.  Outpatient Encounter Prescriptions as of 11/28/2013  Medication Sig  . ALPRAZolam (XANAX) 1 MG tablet Take 1 tablet (1 mg total) by mouth 2 (two) times daily as needed for sleep or anxiety.  . hyoscyamine (LEVSIN, ANASPAZ) 0.125 MG tablet Take 1 tablet (0.125 mg total) by mouth every 4 (four) hours as needed for cramping.  [name] ibuprofen (ADVIL,MOTRIN) 800 MG tablet Take 800 mg by mouth every 8 (eight) hours as needed.    . ergocalciferol (DRISDOL) 50000 UNITS capsule Take 1 capsule (50,000 Units total) by mouth once a week.   BP 110/80  Pulse 79  Temp(Src) 98 F (36.7 C) (Oral)  Wt 135 lb (61.236 kg)  SpO2 99%  Review of Systems  Constitutional: Positive for fatigue. Negative for fever, chills and unexpected weight change.  HENT: Negative for congestion, ear discharge, ear pain, facial swelling, hearing loss, mouth sores, nosebleeds, postnasal drip, rhinorrhea, sinus pressure, sneezing, sore throat, tinnitus, trouble swallowing and voice change.   Eyes: Negative for pain, discharge, redness and visual disturbance.  Respiratory: Negative for cough, chest tightness, shortness of breath, wheezing  and stridor.   Cardiovascular: Negative for chest pain, palpitations and leg swelling.  Musculoskeletal: Negative for arthralgias, myalgias, neck pain and neck stiffness.  Skin: Negative for color change and rash.  Neurological: Negative for dizziness, weakness, light-headedness and headaches.  Hematological: Negative for adenopathy.  Psychiatric/Behavioral: Positive for sleep disturbance and dysphoric mood. The patient is nervous/anxious.        Objective:   Physical Exam  Constitutional: She is oriented to person, place, and time. She appears well-developed and well-nourished. No distress.  HENT:  Head: Normocephalic and atraumatic.  Right Ear: External ear normal.  Left Ear: External ear normal.  Nose: Nose normal.  Mouth/Throat: Oropharynx is clear and moist. No oropharyngeal exudate.  Eyes: Conjunctivae are normal. Pupils are equal, round, and reactive to light. Right eye exhibits no discharge. Left eye exhibits no discharge. No scleral icterus.  Neck: Normal range of motion. Neck supple. No tracheal deviation present. No thyromegaly present.  Cardiovascular: Normal rate, regular rhythm, normal heart sounds and intact distal pulses.  Exam reveals no gallop and no friction rub.   No murmur heard. Pulmonary/Chest: Effort normal and breath sounds normal. No accessory muscle usage. Not tachypneic. No respiratory distress. She has no decreased breath sounds. She has no wheezes. She has no rhonchi. She has no rales. She exhibits no tenderness.  Musculoskeletal: Normal range of motion. She exhibits no edema and no tenderness.  Lymphadenopathy:    She has no cervical adenopathy.  Neurological: She is alert and oriented to person, place, and time. No cranial nerve deficit. She exhibits normal muscle  tone. Coordination normal.  Skin: Skin is warm and dry. No rash noted. She is not diaphoretic. No erythema. No pallor.  Psychiatric: Her speech is normal and behavior is normal. Judgment and  thought content normal. Her mood appears anxious. Cognition and memory are normal. She exhibits a depressed mood.          Assessment & Plan:

## 2013-11-28 NOTE — Assessment & Plan Note (Signed)
Chronic symptoms of hot flashes and vaginal dryness. We discussed that low-dose Paxil may be helpful in reducing hot flashes and irritability. Will try 10 mg daily. Followup in 2-4 weeks.

## 2013-11-28 NOTE — Progress Notes (Signed)
Pre-visit discussion using our clinic review tool. No additional management support is needed unless otherwise documented below in the visit note.  

## 2013-11-28 NOTE — Assessment & Plan Note (Signed)
Significant fatigue likely multifactorial with poor sleep habits, B12 deficiency noted on labs today, and chronic anxiety and depression. Will supplement B12. Will set up counseling. Will start low-dose Paxil 10 mg daily. Follow up 2-4 weeks.

## 2013-11-28 NOTE — Assessment & Plan Note (Signed)
Chronic symptoms of abdominal cramping, made worse with increased anxiety. Will try low-dose Paxil to see if any improvement in symptoms. Follow up 2-4 weeks.

## 2013-11-29 ENCOUNTER — Encounter: Payer: Self-pay | Admitting: Internal Medicine

## 2013-11-29 ENCOUNTER — Ambulatory Visit: Payer: 59

## 2013-11-29 DIAGNOSIS — R5383 Other fatigue: Principal | ICD-10-CM

## 2013-11-29 DIAGNOSIS — R5381 Other malaise: Secondary | ICD-10-CM

## 2013-11-29 LAB — C-REACTIVE PROTEIN: CRP: 0.6 mg/dL (ref 0.5–20.0)

## 2013-11-29 LAB — RENAL FUNCTION PANEL
Albumin: 4.2 g/dL (ref 3.5–5.2)
BUN: 16 mg/dL (ref 6–23)
CHLORIDE: 103 meq/L (ref 96–112)
CO2: 26 meq/L (ref 19–32)
Calcium: 9.2 mg/dL (ref 8.4–10.5)
Creatinine, Ser: 0.6 mg/dL (ref 0.4–1.2)
GFR: 113.25 mL/min (ref >60.00)
Glucose, Bld: 93 mg/dL (ref 70–99)
Phosphorus: 3.5 mg/dL (ref 2.3–4.6)
Potassium: 4.4 mEq/L (ref 3.5–5.1)
Sodium: 140 mEq/L (ref 135–145)

## 2013-11-29 LAB — VITAMIN D 25 HYDROXY (VIT D DEFICIENCY, FRACTURES): VIT D 25 HYDROXY: 27 ng/mL — AB (ref 30–89)

## 2013-11-29 LAB — SEDIMENTATION RATE: Sed Rate: 13 mm/hr (ref 0–22)

## 2013-11-30 ENCOUNTER — Ambulatory Visit (INDEPENDENT_AMBULATORY_CARE_PROVIDER_SITE_OTHER): Payer: 59 | Admitting: *Deleted

## 2013-11-30 ENCOUNTER — Encounter: Payer: Self-pay | Admitting: Internal Medicine

## 2013-11-30 ENCOUNTER — Telehealth: Payer: Self-pay | Admitting: *Deleted

## 2013-11-30 ENCOUNTER — Other Ambulatory Visit (INDEPENDENT_AMBULATORY_CARE_PROVIDER_SITE_OTHER): Payer: 59

## 2013-11-30 DIAGNOSIS — D518 Other vitamin B12 deficiency anemias: Secondary | ICD-10-CM

## 2013-11-30 DIAGNOSIS — M949 Disorder of cartilage, unspecified: Secondary | ICD-10-CM

## 2013-11-30 DIAGNOSIS — M8580 Other specified disorders of bone density and structure, unspecified site: Secondary | ICD-10-CM

## 2013-11-30 DIAGNOSIS — M899 Disorder of bone, unspecified: Secondary | ICD-10-CM

## 2013-11-30 DIAGNOSIS — D519 Vitamin B12 deficiency anemia, unspecified: Secondary | ICD-10-CM

## 2013-11-30 DIAGNOSIS — M2550 Pain in unspecified joint: Secondary | ICD-10-CM

## 2013-11-30 LAB — RHEUMATOID FACTOR: Rhuematoid fact SerPl-aCnc: <10 IU/mL (ref <=14)

## 2013-11-30 MED ORDER — CYANOCOBALAMIN 1000 MCG/ML IJ SOLN
1000.0000 ug | Freq: Once | INTRAMUSCULAR | Status: AC
  Administered 2013-11-30: 1000 ug via INTRAMUSCULAR

## 2013-11-30 MED ORDER — DENOSUMAB 60 MG/ML ~~LOC~~ SOLN
60.0000 mg | Freq: Once | SUBCUTANEOUS | Status: AC
  Administered 2013-11-30: 60 mg via SUBCUTANEOUS

## 2013-11-30 NOTE — Telephone Encounter (Signed)
ANA and Rheumatoid factor

## 2013-11-30 NOTE — Telephone Encounter (Signed)
What labs and dx?  

## 2013-11-30 NOTE — Telephone Encounter (Signed)
What dx?

## 2013-11-30 NOTE — Telephone Encounter (Signed)
Sorry! arthralgia

## 2013-12-01 LAB — ANA: Anti Nuclear Antibody(ANA): NEGATIVE

## 2013-12-02 ENCOUNTER — Encounter: Payer: Self-pay | Admitting: Internal Medicine

## 2013-12-05 ENCOUNTER — Encounter: Payer: Self-pay | Admitting: Internal Medicine

## 2013-12-07 ENCOUNTER — Ambulatory Visit (INDEPENDENT_AMBULATORY_CARE_PROVIDER_SITE_OTHER): Payer: 59 | Admitting: *Deleted

## 2013-12-07 DIAGNOSIS — D518 Other vitamin B12 deficiency anemias: Secondary | ICD-10-CM

## 2013-12-07 DIAGNOSIS — D519 Vitamin B12 deficiency anemia, unspecified: Secondary | ICD-10-CM

## 2013-12-07 MED ORDER — CYANOCOBALAMIN 1000 MCG/ML IJ SOLN
1000.0000 ug | Freq: Once | INTRAMUSCULAR | Status: AC
  Administered 2013-12-07: 1000 ug via INTRAMUSCULAR

## 2013-12-08 ENCOUNTER — Ambulatory Visit: Payer: Self-pay | Admitting: General Surgery

## 2013-12-09 ENCOUNTER — Encounter: Payer: Self-pay | Admitting: General Surgery

## 2013-12-14 ENCOUNTER — Ambulatory Visit (INDEPENDENT_AMBULATORY_CARE_PROVIDER_SITE_OTHER): Payer: 59 | Admitting: *Deleted

## 2013-12-14 ENCOUNTER — Encounter: Payer: Self-pay | Admitting: Internal Medicine

## 2013-12-14 DIAGNOSIS — E538 Deficiency of other specified B group vitamins: Secondary | ICD-10-CM

## 2013-12-14 MED ORDER — CYANOCOBALAMIN 1000 MCG/ML IJ SOLN
1000.0000 ug | Freq: Once | INTRAMUSCULAR | Status: AC
  Administered 2013-12-14: 1000 ug via INTRAMUSCULAR

## 2013-12-22 ENCOUNTER — Encounter: Payer: Self-pay | Admitting: *Deleted

## 2014-01-10 ENCOUNTER — Ambulatory Visit (INDEPENDENT_AMBULATORY_CARE_PROVIDER_SITE_OTHER): Payer: 59 | Admitting: Internal Medicine

## 2014-01-10 ENCOUNTER — Encounter: Payer: Self-pay | Admitting: Internal Medicine

## 2014-01-10 VITALS — BP 102/72 | HR 91 | Temp 98.1°F | Wt 145.0 lb

## 2014-01-10 DIAGNOSIS — R5383 Other fatigue: Secondary | ICD-10-CM

## 2014-01-10 DIAGNOSIS — F4323 Adjustment disorder with mixed anxiety and depressed mood: Secondary | ICD-10-CM

## 2014-01-10 DIAGNOSIS — R5381 Other malaise: Secondary | ICD-10-CM

## 2014-01-10 DIAGNOSIS — N959 Unspecified menopausal and perimenopausal disorder: Secondary | ICD-10-CM

## 2014-01-10 DIAGNOSIS — S0920XA Traumatic rupture of unspecified ear drum, initial encounter: Secondary | ICD-10-CM

## 2014-01-10 DIAGNOSIS — K589 Irritable bowel syndrome without diarrhea: Secondary | ICD-10-CM

## 2014-01-10 DIAGNOSIS — D510 Vitamin B12 deficiency anemia due to intrinsic factor deficiency: Secondary | ICD-10-CM

## 2014-01-10 MED ORDER — PAROXETINE HCL 20 MG PO TABS: 20.0000 mg | ORAL_TABLET | Freq: Every day | ORAL | Status: DC

## 2014-01-10 MED ORDER — CYANOCOBALAMIN 1000 MCG/ML IJ SOLN
1000.0000 ug | Freq: Once | INTRAMUSCULAR | Status: AC
  Administered 2014-01-10: 1000 ug via INTRAMUSCULAR

## 2014-01-10 NOTE — Progress Notes (Signed)
Subjective:    Patient ID: Mikayla Wagner, female    DOB: January 14, 1961, 53 y.o.   MRN: [id number]  HPI 53YO female with anxiety presents for follow up. At last visit, started on Paxil. Initially felt tired with use of Paxil and Xanax. However, this is somewhat improved recently. Notes significant reduction in hot flashes with use of Paxil. Anxiety level also improved. Concerned that thyroid dysfunction may be causing fatigue. Sister has thyroid issues. Recent TSH and T4 normal, however pt would like additional labs performed including T3.  Yesterday, was cleaning ears with q-tip and felt pain in left ear. Now has limited hearing in this ear. No drainage from ear.   Review of Systems  Constitutional: Positive for fatigue. Negative for fever, chills, appetite change and unexpected weight change.  HENT: Positive for ear pain and hearing loss. Negative for congestion, ear discharge, sinus pressure, sore throat, trouble swallowing and voice change.   Eyes: Negative for visual disturbance.  Respiratory: Negative for cough, shortness of breath, wheezing and stridor.   Cardiovascular: Negative for chest pain, palpitations and leg swelling.  Gastrointestinal: Negative for nausea, vomiting, abdominal pain, diarrhea, constipation, blood in stool, abdominal distention and anal bleeding.  Genitourinary: Negative for dysuria and flank pain.  Musculoskeletal: Negative for arthralgias, gait problem, myalgias and neck pain.  Skin: Negative for color change and rash.  Neurological: Negative for dizziness and headaches.  Hematological: Negative for adenopathy. Does not bruise/bleed easily.  Psychiatric/Behavioral: Negative for suicidal ideas, sleep disturbance and dysphoric mood. The patient is not nervous/anxious.        Objective:    BP 102/72  Pulse 91  Temp(Src) 98.1 F (36.7 C) (Oral)  Wt 145 lb (65.772 kg)  SpO2 97% Physical Exam  Constitutional: She is oriented to person, place, and time.  She appears well-developed and well-nourished. No distress.  HENT:  Head: Normocephalic and atraumatic.  Right Ear: Tympanic membrane, external ear and ear canal normal.  Left Ear: External ear normal. Tympanic membrane is erythematous.  Ears:  Nose: Nose normal.  Mouth/Throat: Oropharynx is clear and moist. No oropharyngeal exudate.  Eyes: Conjunctivae are normal. Pupils are equal, round, and reactive to light. Right eye exhibits no discharge. Left eye exhibits no discharge. No scleral icterus.  Neck: Normal range of motion. Neck supple. No tracheal deviation present. No thyromegaly present.  Cardiovascular: Normal rate, regular rhythm, normal heart sounds and intact distal pulses.  Exam reveals no gallop and no friction rub.   No murmur heard. Pulmonary/Chest: Effort normal and breath sounds normal. No accessory muscle usage. Not tachypneic. No respiratory distress. She has no decreased breath sounds. She has no wheezes. She has no rhonchi. She has no rales. She exhibits no tenderness.  Musculoskeletal: Normal range of motion. She exhibits no edema and no tenderness.  Lymphadenopathy:    She has no cervical adenopathy.  Neurological: She is alert and oriented to person, place, and time. No cranial nerve deficit. She exhibits normal muscle tone. Coordination normal.  Skin: Skin is warm and dry. No rash noted. She is not diaphoretic. No erythema. No pallor.  Psychiatric: She has a normal mood and affect. Her behavior is normal. Judgment and thought content normal.          Assessment & Plan:   Problem List Items Addressed This Visit   Adjustment disorder with mixed anxiety and depressed mood     Symptoms improved with addition of Paxil. Appointment with psychologist pending. Will try increase in Paxil to  20mg  daily. Follow up in 4 weeks or sooner as needed.    IBS (irritable bowel syndrome)     Symptoms improved with prn Levsin. Will continue.    Relevant Medications       PARoxetine (PAXIL) tablet   Menopausal disorder - Primary     Symptoms of hot flashes improved with use of Paxil. Will continue to monitor.    Other malaise and fatigue     Persistent fatigue. Repeat B12 level today improved with B12 supplementation. Encouraged her to reduce dose of Alprazolam at bedtime to 1.5mg  or 1mg , as this may be contributing to daytime fatigue.T3 slightly low on labs today. Recent TSH and T4 normal, however. Suspicion of thyroid dysfunction contributing to fatigue low, however offered to set up endocrine evaluation per pt preference.    Relevant Orders      T3, free (Completed)      Thyroid antibodies   Pernicious anemia   Relevant Medications      cyanocobalamin ((VITAMIN B-12)) injection 1,000 mcg (Completed)   Other Relevant Orders      CBC with Differential (Completed)      B12 (Completed)   Traumatic tympanic membrane perforation     Left TM with hematoma and possible anterior perforation, however difficult to visualize because of cerumen against the TM. Will set up ENT evaluation.    Relevant Orders      Ambulatory referral to ENT       Return in about 4 weeks (around 02/07/2014).

## 2014-01-10 NOTE — Patient Instructions (Signed)
Increase Paxil to 20mg  daily.  Follow up in 4 weeks or sooner as needed.

## 2014-01-10 NOTE — Progress Notes (Signed)
Pre visit review using our clinic review tool, if applicable. No additional management support is needed unless otherwise documented below in the visit note. 

## 2014-01-11 ENCOUNTER — Encounter: Payer: Self-pay | Admitting: Internal Medicine

## 2014-01-11 LAB — CBC WITH DIFFERENTIAL/PLATELET
BASOS PCT: 1 % (ref 0.0–3.0)
Basophils Absolute: 0.1 10*3/uL (ref 0.0–0.1)
Eosinophils Absolute: 0.3 10*3/uL (ref 0.0–0.7)
Eosinophils Relative: 3.8 % (ref 0.0–5.0)
HEMATOCRIT: 37.2 % (ref 36.0–46.0)
Hemoglobin: 12 g/dL (ref 12.0–15.0)
LYMPHS ABS: 1.9 10*3/uL (ref 0.7–4.0)
Lymphocytes Relative: 27.3 % (ref 12.0–46.0)
MCHC: 32.3 g/dL (ref 30.0–36.0)
MCV: 85 fl (ref 78.0–100.0)
MONO ABS: 0.5 10*3/uL (ref 0.1–1.0)
Monocytes Relative: 6.8 % (ref 3.0–12.0)
Neutro Abs: 4.4 10*3/uL (ref 1.4–7.7)
Neutrophils Relative %: 61.1 % (ref 43.0–77.0)
Platelets: 266 10*3/uL (ref 150.0–400.0)
RBC: 4.38 Mil/uL (ref 3.87–5.11)
RDW: 14 % (ref 11.5–14.6)
WBC: 7.1 10*3/uL (ref 4.5–10.5)

## 2014-01-11 LAB — T3, FREE: T3, Free: 2.2 pg/mL — ABNORMAL LOW (ref 2.3–4.2)

## 2014-01-11 LAB — VITAMIN B12: Vitamin B-12: 476 pg/mL (ref 211–911)

## 2014-01-11 NOTE — Assessment & Plan Note (Signed)
Left TM with hematoma and possible anterior perforation, however difficult to visualize because of cerumen against the TM. Will set up ENT evaluation.

## 2014-01-11 NOTE — Assessment & Plan Note (Signed)
Persistent fatigue. Repeat B12 level today improved with B12 supplementation. Encouraged her to reduce dose of Alprazolam at bedtime to 1.5mg  or 1mg , as this may be contributing to daytime fatigue.T3 slightly low on labs today. Recent TSH and T4 normal, however. Suspicion of thyroid dysfunction contributing to fatigue low, however offered to set up endocrine evaluation per pt preference.

## 2014-01-11 NOTE — Assessment & Plan Note (Signed)
Symptoms of hot flashes improved with use of Paxil. Will continue to monitor.

## 2014-01-11 NOTE — Assessment & Plan Note (Signed)
Symptoms improved with prn Levsin. Will continue.

## 2014-01-11 NOTE — Assessment & Plan Note (Signed)
Symptoms improved with addition of Paxil. Appointment with psychologist pending. Will try increase in Paxil to 20mg  daily. Follow up in 4 weeks or sooner as needed.

## 2014-01-12 ENCOUNTER — Encounter: Payer: Self-pay | Admitting: Internal Medicine

## 2014-01-12 ENCOUNTER — Encounter: Payer: Self-pay | Admitting: Emergency Medicine

## 2014-01-12 LAB — THYROID ANTIBODIES
Thyroglobulin Ab: <20 U/mL (ref <40.0)
Thyroperoxidase Ab SerPl-aCnc: <10 IU/mL (ref <35.0)

## 2014-02-03 ENCOUNTER — Encounter: Payer: Self-pay | Admitting: Internal Medicine

## 2014-02-03 ENCOUNTER — Telehealth: Payer: Self-pay | Admitting: Internal Medicine

## 2014-02-03 DIAGNOSIS — N952 Postmenopausal atrophic vaginitis: Secondary | ICD-10-CM

## 2014-02-03 MED ORDER — OSPEMIFENE 60 MG PO TABS: 60.0000 | ORAL_TABLET | Freq: Every day | ORAL | Status: DC

## 2014-02-03 MED ORDER — NITROFURANTOIN MACROCRYSTAL 50 MG PO CAPS: 50.0000 mg | ORAL_CAPSULE | Freq: Every day | ORAL | Status: DC

## 2014-02-03 NOTE — Telephone Encounter (Signed)
May is fine. 9min Macrodantin 50mg  po daily (please confirm that this was the preventative dose she was using in the past, to take after intercourse). #30 no refill.

## 2014-02-03 NOTE — Telephone Encounter (Signed)
What are the instructions for the Macrodantin?

## 2014-02-03 NOTE — Telephone Encounter (Signed)
Pt called today to schedule her 4 wk f/u from visit 3/3.  Pt states she needs late afternoon due to her work.  4 wk f/u would have been due week of 3/31.  No 30 min afternoon slots available until May.  States she can come the next month if it is okay with Dr. Gilford Rile.  Please advise.  Make appt and send mychart msg, per pt.

## 2014-02-06 NOTE — Telephone Encounter (Signed)
Appointment 5/7 pt aware °

## 2014-02-06 NOTE — Telephone Encounter (Signed)
Left message for pt to call office Please schedule appointment in may

## 2014-03-16 ENCOUNTER — Encounter: Payer: Self-pay | Admitting: Internal Medicine

## 2014-03-16 ENCOUNTER — Ambulatory Visit: Payer: 59 | Admitting: Internal Medicine

## 2014-03-20 ENCOUNTER — Encounter: Payer: Self-pay | Admitting: Internal Medicine

## 2014-03-20 NOTE — Telephone Encounter (Signed)
How do we fix her no show? She did send Mikayla Wagner

## 2014-04-07 ENCOUNTER — Ambulatory Visit (INDEPENDENT_AMBULATORY_CARE_PROVIDER_SITE_OTHER): Payer: 59 | Admitting: Internal Medicine

## 2014-04-07 ENCOUNTER — Encounter: Payer: Self-pay | Admitting: Internal Medicine

## 2014-04-07 VITALS — BP 124/82 | HR 93 | Temp 98.1°F | Ht 62.0 in | Wt 152.5 lb

## 2014-04-07 DIAGNOSIS — E663 Overweight: Secondary | ICD-10-CM

## 2014-04-07 DIAGNOSIS — R82998 Other abnormal findings in urine: Secondary | ICD-10-CM

## 2014-04-07 DIAGNOSIS — G4700 Insomnia, unspecified: Secondary | ICD-10-CM

## 2014-04-07 DIAGNOSIS — R635 Abnormal weight gain: Secondary | ICD-10-CM

## 2014-04-07 DIAGNOSIS — E538 Deficiency of other specified B group vitamins: Secondary | ICD-10-CM

## 2014-04-07 DIAGNOSIS — R3989 Other symptoms and signs involving the genitourinary system: Secondary | ICD-10-CM

## 2014-04-07 LAB — COMPREHENSIVE METABOLIC PANEL
ALK PHOS: 45 U/L (ref 39–117)
ALT: 19 U/L (ref 0–35)
AST: 23 U/L (ref 0–37)
Albumin: 4.1 g/dL (ref 3.5–5.2)
BUN: 11 mg/dL (ref 6–23)
CALCIUM: 9.5 mg/dL (ref 8.4–10.5)
CO2: 31 meq/L (ref 19–32)
Chloride: 101 mEq/L (ref 96–112)
Creatinine, Ser: 0.6 mg/dL (ref 0.4–1.2)
GFR: 104.86 mL/min (ref >60.00)
GLUCOSE: 80 mg/dL (ref 70–99)
POTASSIUM: 4.5 meq/L (ref 3.5–5.1)
Sodium: 138 mEq/L (ref 135–145)
TOTAL PROTEIN: 6.8 g/dL (ref 6.0–8.3)
Total Bilirubin: 0.5 mg/dL (ref 0.2–1.2)

## 2014-04-07 LAB — POCT URINALYSIS DIPSTICK
Bilirubin, UA: NEGATIVE
GLUCOSE UA: NEGATIVE
Ketones, UA: NEGATIVE
NITRITE UA: NEGATIVE
PH UA: 6.5
Protein, UA: NEGATIVE
Spec Grav, UA: <=1.005
Urobilinogen, UA: 0.2

## 2014-04-07 LAB — TSH: TSH: 1.66 u[IU]/mL (ref 0.35–4.50)

## 2014-04-07 LAB — T4, FREE: Free T4: 0.75 ng/dL (ref 0.60–1.60)

## 2014-04-07 MED ORDER — FLUCONAZOLE 150 MG PO TABS: 150.0000 mg | ORAL_TABLET | Freq: Every day | ORAL | Status: DC

## 2014-04-07 MED ORDER — PHENTERMINE HCL 37.5 MG PO CAPS: 37.5000 mg | ORAL_CAPSULE | ORAL | Status: DC

## 2014-04-07 MED ORDER — CYANOCOBALAMIN 1000 MCG/ML IJ SOLN
1000.0000 ug | Freq: Once | INTRAMUSCULAR | Status: AC
  Administered 2014-04-07: 1000 ug via INTRAMUSCULAR

## 2014-04-07 MED ORDER — ALPRAZOLAM 1 MG PO TABS: 1.0000 mg | ORAL_TABLET | Freq: Two times a day (BID) | ORAL | Status: DC | PRN

## 2014-04-07 MED ORDER — CIPROFLOXACIN HCL 500 MG PO TABS: 500.0000 mg | ORAL_TABLET | Freq: Two times a day (BID) | ORAL | Status: DC

## 2014-04-07 NOTE — Patient Instructions (Signed)
Start Phentermine 37.5mg  daily to help control appetite.  We will send labs today.  Follow up in 4 weeks.

## 2014-04-07 NOTE — Progress Notes (Signed)
Pre visit review using our clinic review tool, if applicable. No additional management support is needed unless otherwise documented below in the visit note. 

## 2014-04-07 NOTE — Assessment & Plan Note (Signed)
Continue Alprazolam prn 

## 2014-04-07 NOTE — Assessment & Plan Note (Signed)
Wt Readings from Last 3 Encounters:  04/07/14 152 lb 8 oz (69.174 kg)  01/10/14 145 lb (65.772 kg)  11/28/13 135 lb (61.236 kg)   Nearly 20lb weight gain in 5 months. Will check thyroid function with labs today. Will start phentermine to help with appeite

## 2014-04-07 NOTE — Assessment & Plan Note (Signed)
Urinalysis most consistent with UTI. Will start Cipro. Send urine for culture. Will also send urine GC/CHL given recent unprotected intercourse.

## 2014-04-07 NOTE — Progress Notes (Signed)
Subjective:    Patient ID: Mikayla Wagner, female    DOB: [birth year], 53 y.o.   MRN: [id number]  HPI 53YO female presents for follow up.  Concerned about weight. No recent changes in meds. Used Adipex in the past with some benefit. Trying to follow healthy diet. No specific exercise. Notes some ongoing fatigue. Has not been taking scheduled B12 shots. Concerned about possible exposure to STD after having intercourse with boyfriend who cheated on her. Notes cloudy urine, however no urinary symptoms such as urgency or frequency. No dysuria, pelvic or flank pain. No fever. No vaginal discharge.  Review of Systems  Constitutional: Positive for fatigue. Negative for fever, chills, appetite change and unexpected weight change.  HENT: Negative for congestion, ear pain, sinus pressure, sore throat, trouble swallowing and voice change.   Eyes: Negative for visual disturbance.  Respiratory: Negative for cough, shortness of breath, wheezing and stridor.   Cardiovascular: Negative for chest pain, palpitations and leg swelling.  Gastrointestinal: Negative for nausea, vomiting, abdominal pain, diarrhea, constipation, blood in stool, abdominal distention and anal bleeding.  Genitourinary: Negative for dysuria and flank pain.  Musculoskeletal: Negative for arthralgias, gait problem, myalgias and neck pain.  Skin: Negative for color change and rash.  Neurological: Negative for dizziness and headaches.  Hematological: Negative for adenopathy. Does not bruise/bleed easily.  Psychiatric/Behavioral: Negative for suicidal ideas, sleep disturbance and dysphoric mood. The patient is nervous/anxious.        Objective:    BP 124/82  Pulse 93  Temp(Src) 98.1 F (36.7 C) (Oral)  Ht 5\' 2"  (1.575 m)  Wt 152 lb 8 oz (69.174 kg)  BMI 27.89 kg/m2  SpO2 96% Physical Exam  Constitutional: She is oriented to person, place, and time. She appears well-developed and well-nourished. No distress.  HENT:  Head:  Normocephalic and atraumatic.  Right Ear: External ear normal.  Left Ear: External ear normal.  Nose: Nose normal.  Mouth/Throat: Oropharynx is clear and moist. No oropharyngeal exudate.  Eyes: Conjunctivae are normal. Pupils are equal, round, and reactive to light. Right eye exhibits no discharge. Left eye exhibits no discharge. No scleral icterus.  Neck: Normal range of motion. Neck supple. No tracheal deviation present. No thyromegaly present.  Cardiovascular: Normal rate, regular rhythm, normal heart sounds and intact distal pulses.  Exam reveals no gallop and no friction rub.   No murmur heard. Pulmonary/Chest: Effort normal and breath sounds normal. No accessory muscle usage. Not tachypneic. No respiratory distress. She has no decreased breath sounds. She has no wheezes. She has no rhonchi. She has no rales. She exhibits no tenderness.  Musculoskeletal: Normal range of motion. She exhibits no edema and no tenderness.  Lymphadenopathy:    She has no cervical adenopathy.  Neurological: She is alert and oriented to person, place, and time. No cranial nerve deficit. She exhibits normal muscle tone. Coordination normal.  Skin: Skin is warm and dry. No rash noted. She is not diaphoretic. No erythema. No pallor.  Psychiatric: Her speech is normal and behavior is normal. Judgment and thought content normal. Her mood appears anxious. Cognition and memory are normal.          Assessment & Plan:   Problem List Items Addressed This Visit     Unprioritized   Abnormal urine color     Urinalysis most consistent with UTI. Will start Cipro. Send urine for culture. Will also send urine GC/CHL given recent unprotected intercourse.    Relevant Medications  ciprofloxacin (CIPRO) tablet      fluconazole (DIFLUCAN) tablet 150 mg   Other Relevant Orders      POCT Urinalysis Dipstick (Completed)      GC/chlamydia probe amp, urine      Urine culture   Insomnia     Continue Alprazolam prn.     Relevant Medications      ALPRAZolam (XANAX) tablet   Overweight (BMI 25.0-29.9) - Primary      Wt Readings from Last 3 Encounters:  04/07/14 152 lb 8 oz (69.174 kg)  01/10/14 145 lb (65.772 kg)  11/28/13 135 lb (61.236 kg)   Nearly 20lb weight gain in 5 months. Will check thyroid function with labs today. Will start phentermine to help with appeite    Relevant Medications      phentermine capsule   Other Relevant Orders      T4, free (Completed)      TSH (Completed)      Comprehensive metabolic panel (Completed)    Other Visit Diagnoses   B12 deficiency        Relevant Medications       cyanocobalamin ((VITAMIN B-12)) injection 1,000 mcg (Completed)        Return in about 4 weeks (around 05/05/2014) for Recheck Weight.

## 2014-04-08 LAB — GC/CHLAMYDIA PROBE AMP, URINE
Chlamydia, Swab/Urine, PCR: NEGATIVE
GC Probe Amp, Urine: NEGATIVE

## 2014-04-10 ENCOUNTER — Encounter: Payer: Self-pay | Admitting: Internal Medicine

## 2014-04-10 LAB — URINE CULTURE: Colony Count: >=100000

## 2014-05-09 ENCOUNTER — Ambulatory Visit (INDEPENDENT_AMBULATORY_CARE_PROVIDER_SITE_OTHER): Payer: 59 | Admitting: Internal Medicine

## 2014-05-09 ENCOUNTER — Ambulatory Visit: Payer: 59 | Admitting: Internal Medicine

## 2014-05-09 ENCOUNTER — Encounter: Payer: Self-pay | Admitting: Internal Medicine

## 2014-05-09 ENCOUNTER — Encounter: Payer: Self-pay | Admitting: General Surgery

## 2014-05-09 VITALS — BP 100/82 | HR 92 | Temp 97.9°F | Ht 62.0 in | Wt 151.5 lb

## 2014-05-09 DIAGNOSIS — R300 Dysuria: Secondary | ICD-10-CM

## 2014-05-09 DIAGNOSIS — E663 Overweight: Secondary | ICD-10-CM

## 2014-05-09 DIAGNOSIS — F4321 Adjustment disorder with depressed mood: Secondary | ICD-10-CM

## 2014-05-09 LAB — POCT URINALYSIS DIPSTICK
Bilirubin, UA: NEGATIVE
Blood, UA: NEGATIVE
GLUCOSE UA: NEGATIVE
KETONES UA: NEGATIVE
Nitrite, UA: NEGATIVE
PROTEIN UA: NEGATIVE
Spec Grav, UA: 1.025
Urobilinogen, UA: 0.2
pH, UA: 6

## 2014-05-09 MED ORDER — PHENTERMINE HCL 37.5 MG PO CAPS: 37.5000 mg | ORAL_CAPSULE | ORAL | Status: DC

## 2014-05-09 NOTE — Progress Notes (Signed)
Pre visit review using our clinic review tool, if applicable. No additional management support is needed unless otherwise documented below in the visit note. 

## 2014-05-09 NOTE — Assessment & Plan Note (Signed)
Significant recent stressors including loss of her job and legal issues in relationship with ex-boyfriend. Offered support today. Will try to get her in with psychologist today before she loses insurance. If not able to set this up, encouraged her to set up [facility] or [place] coverage for her insurance.

## 2014-05-09 NOTE — Assessment & Plan Note (Signed)
Wt Readings from Last 3 Encounters:  05/09/14 151 lb 8 oz (68.72 kg)  04/07/14 152 lb 8 oz (69.174 kg)  01/10/14 145 lb (65.772 kg)  Body mass index is 27.7 kg/(m^2).  Encouraged healthy diet and exercise. Will continue phentermine to help with appetite suppression. Follow up in 3 months and prn.

## 2014-05-09 NOTE — Assessment & Plan Note (Signed)
Occasional dysuria. UA normal today except for few leukocytes. Will send urine for culture and monitor symptoms for now.

## 2014-05-09 NOTE — Progress Notes (Signed)
Subjective:    Patient ID: Mikayla Wagner, female    DOB: 05-08-1961, 53 y.o.   MRN: [id number]  HPI 53YO female presents for follow up.  Depression -  Worsening symptoms of depression and anxiety related to ongoing issues with her ex-boyfriend. She also recently lost her job after being accused of a HIPPA violation. She is tearful describing this. Has follow up with psychologist tomorrow, but health insurance coverage ends today. She denies depression, states she is just frustrated by situation.  Dysuria - Occasional symptoms of burning with urination. No fever, chills, flank pain, hematuria. Symptoms occur off and on for weeks.  Review of Systems  Constitutional: Negative for fever, chills, appetite change, fatigue and unexpected weight change.  Eyes: Negative for visual disturbance.  Respiratory: Negative for shortness of breath.   Cardiovascular: Negative for chest pain and leg swelling.  Gastrointestinal: Negative for abdominal pain.  Genitourinary: Positive for dysuria. Negative for urgency, frequency, hematuria, flank pain, decreased urine volume, vaginal bleeding, vaginal discharge, difficulty urinating, genital sores, vaginal pain and pelvic pain.  Skin: Negative for color change and rash.  Hematological: Negative for adenopathy. Does not bruise/bleed easily.  Psychiatric/Behavioral: Positive for sleep disturbance and dysphoric mood. Negative for suicidal ideas and self-injury. The patient is nervous/anxious.        Objective:    BP 100/82  Pulse 92  Temp(Src) 97.9 F (36.6 C) (Oral)  Ht 5\' 2"  (1.575 m)  Wt 151 lb 8 oz (68.72 kg)  BMI 27.70 kg/m2  SpO2 95% Physical Exam  Constitutional: She is oriented to person, place, and time. She appears well-developed and well-nourished. No distress.  HENT:  Head: Normocephalic and atraumatic.  Right Ear: External ear normal.  Left Ear: External ear normal.  Nose: Nose normal.  Mouth/Throat: Oropharynx is clear and moist. No  oropharyngeal exudate.  Eyes: Conjunctivae are normal. Pupils are equal, round, and reactive to light. Right eye exhibits no discharge. Left eye exhibits no discharge. No scleral icterus.  Neck: Normal range of motion. Neck supple. No tracheal deviation present. No thyromegaly present.  Cardiovascular: Normal rate, regular rhythm, normal heart sounds and intact distal pulses.  Exam reveals no gallop and no friction rub.   No murmur heard. Pulmonary/Chest: Effort normal and breath sounds normal. No accessory muscle usage. Not tachypneic. No respiratory distress. She has no decreased breath sounds. She has no wheezes. She has no rhonchi. She has no rales. She exhibits no tenderness.  Musculoskeletal: Normal range of motion. She exhibits no edema and no tenderness.  Lymphadenopathy:    She has no cervical adenopathy.  Neurological: She is alert and oriented to person, place, and time. No cranial nerve deficit. She exhibits normal muscle tone. Coordination normal.  Skin: Skin is warm and dry. No rash noted. She is not diaphoretic. No erythema. No pallor.  Psychiatric: Judgment and thought content normal. Her mood appears anxious. Her affect is labile. Her speech is rapid and/or pressured. She is agitated. Thought content is not paranoid and not delusional. She exhibits a depressed mood. She expresses no suicidal ideation.          Assessment & Plan:  Over 53min of which >50% spent in face-to-face contact with patient discussing plan of care  Problem List Items Addressed This Visit     Unprioritized   Adjustment disorder with depressed mood - Primary     Significant recent stressors including loss of her job and legal issues in relationship with ex-boyfriend. Offered support today. Will  try to get her in with psychologist today before she loses insurance. If not able to set this up, encouraged her to set up [facility] or [employer] coverage for her insurance.    Relevant Orders      CULTURE,  URINE COMPREHENSIVE   Dysuria     Occasional dysuria. UA normal today except for few leukocytes. Will send urine for culture and monitor symptoms for now.    Relevant Orders      POCT Urinalysis Dipstick (Completed)      CULTURE, URINE COMPREHENSIVE      CULTURE, URINE COMPREHENSIVE   Overweight (BMI 25.0-29.9)      Wt Readings from Last 3 Encounters:  05/09/14 151 lb 8 oz (68.72 kg)  04/07/14 152 lb 8 oz (69.174 kg)  01/10/14 145 lb (65.772 kg)  Body mass index is 27.7 kg/(m^2).  Encouraged healthy diet and exercise. Will continue phentermine to help with appetite suppression. Follow up in 3 months and prn.    Relevant Medications      phentermine capsule       Return in about 3 months (around 08/09/2014) for Physical.

## 2014-05-11 ENCOUNTER — Ambulatory Visit: Payer: 59 | Admitting: Psychology

## 2014-05-11 LAB — CULTURE, URINE COMPREHENSIVE
Colony Count: NO GROWTH
ORGANISM ID, BACTERIA: NO GROWTH

## 2014-06-08 ENCOUNTER — Encounter: Payer: Self-pay | Admitting: Internal Medicine

## 2014-06-08 ENCOUNTER — Telehealth: Payer: Self-pay | Admitting: *Deleted

## 2014-06-08 NOTE — Telephone Encounter (Signed)
FYI:  Per MyChart email, I replied back to pt advising her that you are out of the office and that she has one refill on the previous Rx that she should be able to fill -   Hi Dr Gilford Rile...well it's been 30 days now for me to get a refill on the phentermine 37.5.Marland Kitchenyou said I could call maybe for 3 mo until I get a job..which I have an interview today!  I know I have to pick it up..so just sometime by tomorrow I could get it?? My old insurance didn't pay for it anyway and it was only like 4.00   Thanks  Mikayla Wagner

## 2014-07-25 ENCOUNTER — Encounter: Payer: Self-pay | Admitting: Internal Medicine

## 2014-08-09 ENCOUNTER — Encounter: Payer: 59 | Admitting: Internal Medicine

## 2014-09-19 ENCOUNTER — Other Ambulatory Visit: Payer: Self-pay | Admitting: Internal Medicine

## 2014-09-19 ENCOUNTER — Encounter: Payer: Self-pay | Admitting: Internal Medicine

## 2014-09-19 ENCOUNTER — Other Ambulatory Visit: Payer: Self-pay | Admitting: *Deleted

## 2014-09-19 NOTE — Telephone Encounter (Signed)
Last OV 9.30.15, last refill 10.13.15.  please advise refill

## 2014-09-19 NOTE — Telephone Encounter (Signed)
Already refilled today

## 2014-12-16 ENCOUNTER — Other Ambulatory Visit: Payer: Self-pay | Admitting: Internal Medicine

## 2015-01-29 ENCOUNTER — Other Ambulatory Visit: Payer: Self-pay | Admitting: Internal Medicine

## 2015-01-29 NOTE — Telephone Encounter (Signed)
Okay to refill? Last OV  6/15

## 2015-01-30 ENCOUNTER — Other Ambulatory Visit: Payer: Self-pay | Admitting: Internal Medicine

## 2015-01-30 NOTE — Telephone Encounter (Signed)
Electronic Rx request for phentermine received. Patient last office visit was 05/10/15 and medication last filled the same day with 1 additional refill. Please advise.

## 2015-03-01 ENCOUNTER — Other Ambulatory Visit: Payer: Self-pay | Admitting: Internal Medicine

## 2015-03-05 NOTE — Telephone Encounter (Signed)
Last appt 05/09/14, ok refill?

## 2015-04-02 ENCOUNTER — Encounter: Payer: Self-pay | Admitting: *Deleted

## 2015-04-02 ENCOUNTER — Telehealth: Payer: Self-pay | Admitting: Internal Medicine

## 2015-04-02 ENCOUNTER — Other Ambulatory Visit: Payer: Self-pay | Admitting: *Deleted

## 2015-04-02 ENCOUNTER — Encounter: Payer: Self-pay | Admitting: Internal Medicine

## 2015-04-02 MED ORDER — HYOSCYAMINE SULFATE 0.125 MG SL SUBL: SUBLINGUAL_TABLET | SUBLINGUAL | Status: DC

## 2015-04-02 NOTE — Telephone Encounter (Signed)
Rx sent to pharmacy   

## 2015-04-02 NOTE — Telephone Encounter (Signed)
Fine to refill Levsin x 1 month

## 2015-04-02 NOTE — Telephone Encounter (Signed)
Pt called wanting to have a Rx refill for stomach. [phone]

## 2015-05-21 ENCOUNTER — Ambulatory Visit (INDEPENDENT_AMBULATORY_CARE_PROVIDER_SITE_OTHER): Payer: Self-pay | Admitting: Internal Medicine

## 2015-05-21 ENCOUNTER — Encounter: Payer: Self-pay | Admitting: Internal Medicine

## 2015-05-21 VITALS — BP 110/78 | HR 92 | Temp 98.2°F | Ht 62.0 in | Wt 153.1 lb

## 2015-05-21 DIAGNOSIS — R920 Mammographic microcalcification found on diagnostic imaging of breast: Secondary | ICD-10-CM

## 2015-05-21 DIAGNOSIS — Z1211 Encounter for screening for malignant neoplasm of colon: Secondary | ICD-10-CM

## 2015-05-21 DIAGNOSIS — E663 Overweight: Secondary | ICD-10-CM

## 2015-05-21 DIAGNOSIS — F419 Anxiety disorder, unspecified: Secondary | ICD-10-CM

## 2015-05-21 DIAGNOSIS — K589 Irritable bowel syndrome without diarrhea: Secondary | ICD-10-CM

## 2015-05-21 MED ORDER — HYOSCYAMINE SULFATE 0.125 MG PO TABS
0.1250 mg | ORAL_TABLET | ORAL | Status: DC | PRN
Start: 2015-05-21 — End: 2016-04-20

## 2015-05-21 NOTE — Assessment & Plan Note (Signed)
Referral placed for screening colonoscopy.

## 2015-05-21 NOTE — Patient Instructions (Signed)
We will schedule evaluation for colonoscopy and mammogram.

## 2015-05-21 NOTE — Assessment & Plan Note (Signed)
Encouraged healthy diet and exercise

## 2015-05-21 NOTE — Assessment & Plan Note (Signed)
Overdue for mammogram because of concern about cost and lapse of insurance. Placed order for mammogram today. Encouraged her to discuss patient assistance programs with [place].

## 2015-05-21 NOTE — Progress Notes (Signed)
Subjective:    Patient ID: Mikayla Wagner, female    DOB: 1961/10/07, 54 y.o.   MRN: [id number]  HPI  54YO female presents for follow up.  Working at [employer] in [place] to have legal struggles with ex. Restraining order in place.  Anxiety improved with Wellbutrin. Followed by psychiatry.  Abdominal cramping improved with Levsin. Taking Levsin daily. No diarrhea or constipation.   Wt Readings from Last 3 Encounters:  05/21/15 153 lb 2 oz (69.457 kg)  05/09/14 151 lb 8 oz (68.72 kg)  04/07/14 152 lb 8 oz (69.174 kg)     Past medical, surgical, family and social history per today's encounter.  Review of Systems  Constitutional: Negative for fever, chills, appetite change, fatigue and unexpected weight change.  Eyes: Negative for visual disturbance.  Respiratory: Negative for shortness of breath.   Cardiovascular: Negative for chest pain and leg swelling.  Gastrointestinal: Negative for nausea, vomiting, abdominal pain, diarrhea, constipation and abdominal distention.  Skin: Negative for color change and rash.  Hematological: Negative for adenopathy. Does not bruise/bleed easily.  Psychiatric/Behavioral: Negative for suicidal ideas, sleep disturbance and dysphoric mood. The patient is nervous/anxious.        Objective:    BP 110/78 mmHg  Pulse 92  Temp(Src) 98.2 F (36.8 C) (Oral)  Ht 5\' 2"  (1.575 m)  Wt 153 lb 2 oz (69.457 kg)  BMI 28.00 kg/m2  SpO2 96% Physical Exam  Constitutional: She is oriented to person, place, and time. She appears well-developed and well-nourished. No distress.  HENT:  Head: Normocephalic and atraumatic.  Right Ear: External ear normal.  Left Ear: External ear normal.  Nose: Nose normal.  Mouth/Throat: Oropharynx is clear and moist. No oropharyngeal exudate.  Eyes: Conjunctivae are normal. Pupils are equal, round, and reactive to light. Right eye exhibits no discharge. Left eye exhibits no discharge. No scleral icterus.  Neck:  Normal range of motion. Neck supple. No tracheal deviation present. No thyromegaly present.  Cardiovascular: Normal rate, regular rhythm, normal heart sounds and intact distal pulses.  Exam reveals no gallop and no friction rub.   No murmur heard. Pulmonary/Chest: Effort normal and breath sounds normal. No respiratory distress. She has no wheezes. She has no rales. She exhibits no tenderness.  Musculoskeletal: Normal range of motion. She exhibits no edema or tenderness.  Lymphadenopathy:    She has no cervical adenopathy.  Neurological: She is alert and oriented to person, place, and time. No cranial nerve deficit. She exhibits normal muscle tone. Coordination normal.  Skin: Skin is warm and dry. No rash noted. She is not diaphoretic. No erythema. No pallor.  Psychiatric: Her behavior is normal. Judgment and thought content normal. Her mood appears anxious.          Assessment & Plan:   Problem List Items Addressed This Visit      Unprioritized   Abnormal mammogram with microcalcification    Overdue for mammogram because of concern about cost and lapse of insurance. Placed order for mammogram today. Encouraged her to discuss patient assistance programs with [facility].      Relevant Orders   MM Digital Diagnostic Unilat L   Anxiety    Symptoms improved with Wellbutrin and Xanax. Will continue to follow up with psychiatry, Dr. Jackelyn Poling at [facility].      Relevant Medications   buPROPion (WELLBUTRIN) 100 MG tablet   IBS (irritable bowel syndrome) - Primary    Symptoms improved with Levsin. Will continue.  Relevant Medications   hyoscyamine (LEVSIN, ANASPAZ) 0.125 MG tablet   Overweight (BMI 25.0-29.9)    Encouraged healthy diet and exercise.      Special screening for malignant neoplasms, colon    Referral placed for screening colonoscopy.      Relevant Orders   Ambulatory referral to Gastroenterology       Return in about 6 months (around 11/21/2015) for  Recheck.

## 2015-05-21 NOTE — Assessment & Plan Note (Signed)
Symptoms improved with Wellbutrin and Xanax. Will continue to follow up with psychiatry, Dr. Jackelyn Poling at [facility].

## 2015-05-21 NOTE — Progress Notes (Signed)
Pre visit review using our clinic review tool, if applicable. No additional management support is needed unless otherwise documented below in the visit note. 

## 2015-05-21 NOTE — Assessment & Plan Note (Signed)
Symptoms improved with Levsin. Will continue.

## 2015-06-14 ENCOUNTER — Other Ambulatory Visit: Payer: Self-pay | Admitting: Internal Medicine

## 2015-06-14 DIAGNOSIS — R928 Other abnormal and inconclusive findings on diagnostic imaging of breast: Secondary | ICD-10-CM

## 2015-06-15 ENCOUNTER — Encounter: Payer: Self-pay | Admitting: Internal Medicine

## 2015-06-17 LAB — HM MAMMOGRAPHY

## 2015-06-20 ENCOUNTER — Other Ambulatory Visit: Payer: Self-pay

## 2015-06-20 ENCOUNTER — Ambulatory Visit: Payer: Self-pay

## 2015-06-28 ENCOUNTER — Ambulatory Visit
Admission: RE | Admit: 2015-06-28 | Discharge: 2015-06-28 | Disposition: A | Discharge status: Home / Self Care | Payer: Self-pay | Source: Ambulatory Visit | Attending: Internal Medicine | Admitting: Internal Medicine

## 2015-06-28 ENCOUNTER — Other Ambulatory Visit: Payer: Self-pay | Admitting: Internal Medicine

## 2015-06-28 ENCOUNTER — Ambulatory Visit: Payer: Self-pay

## 2015-06-28 DIAGNOSIS — R928 Other abnormal and inconclusive findings on diagnostic imaging of breast: Secondary | ICD-10-CM

## 2015-07-20 ENCOUNTER — Encounter: Payer: Self-pay | Admitting: Internal Medicine

## 2015-10-15 ENCOUNTER — Encounter: Payer: Self-pay | Admitting: Internal Medicine

## 2015-10-15 ENCOUNTER — Other Ambulatory Visit: Payer: Self-pay

## 2015-10-15 MED ORDER — NITROFURANTOIN MONOHYD MACRO 100 MG PO CAPS: 100.0000 mg | ORAL_CAPSULE | Freq: Every day | ORAL | Status: DC

## 2016-02-20 ENCOUNTER — Ambulatory Visit: Payer: Self-pay | Admitting: Internal Medicine

## 2016-03-31 ENCOUNTER — Encounter: Payer: Self-pay | Admitting: Internal Medicine

## 2016-04-20 ENCOUNTER — Other Ambulatory Visit: Payer: Self-pay | Admitting: Internal Medicine

## 2016-05-09 ENCOUNTER — Ambulatory Visit: Payer: Self-pay | Admitting: Internal Medicine

## 2016-05-16 ENCOUNTER — Encounter: Payer: Self-pay | Admitting: Internal Medicine

## 2016-05-16 ENCOUNTER — Ambulatory Visit (INDEPENDENT_AMBULATORY_CARE_PROVIDER_SITE_OTHER): Payer: 59 | Admitting: Internal Medicine

## 2016-05-16 ENCOUNTER — Other Ambulatory Visit: Payer: Self-pay | Admitting: Internal Medicine

## 2016-05-16 VITALS — BP 128/88 | HR 82 | Ht 62.0 in | Wt 161.0 lb

## 2016-05-16 DIAGNOSIS — Z0000 Encounter for general adult medical examination without abnormal findings: Secondary | ICD-10-CM

## 2016-05-16 DIAGNOSIS — Z1231 Encounter for screening mammogram for malignant neoplasm of breast: Secondary | ICD-10-CM

## 2016-05-16 DIAGNOSIS — Z1211 Encounter for screening for malignant neoplasm of colon: Secondary | ICD-10-CM

## 2016-05-16 DIAGNOSIS — F419 Anxiety disorder, unspecified: Secondary | ICD-10-CM

## 2016-05-16 LAB — CBC WITH DIFFERENTIAL/PLATELET
BASOS ABS: 0 10*3/uL (ref 0.0–0.1)
Basophils Relative: 0.7 % (ref 0.0–3.0)
EOS ABS: 0.2 10*3/uL (ref 0.0–0.7)
Eosinophils Relative: 2.8 % (ref 0.0–5.0)
HEMATOCRIT: 42.7 % (ref 36.0–46.0)
HEMOGLOBIN: 14.1 g/dL (ref 12.0–15.0)
LYMPHS PCT: 23.9 % (ref 12.0–46.0)
Lymphs Abs: 1.5 10*3/uL (ref 0.7–4.0)
MCHC: 33.1 g/dL (ref 30.0–36.0)
MCV: 81.9 fl (ref 78.0–100.0)
Monocytes Absolute: 0.3 10*3/uL (ref 0.1–1.0)
Monocytes Relative: 5.1 % (ref 3.0–12.0)
NEUTROS ABS: 4.3 10*3/uL (ref 1.4–7.7)
Neutrophils Relative %: 67.5 % (ref 43.0–77.0)
PLATELETS: 349 10*3/uL (ref 150.0–400.0)
RBC: 5.21 Mil/uL — ABNORMAL HIGH (ref 3.87–5.11)
RDW: 14.7 % (ref 11.5–15.5)
WBC: 6.3 10*3/uL (ref 4.0–10.5)

## 2016-05-16 LAB — LIPID PANEL
CHOL/HDL RATIO: 3
Cholesterol: 193 mg/dL (ref 0–200)
HDL: 66.8 mg/dL (ref >39.00)
LDL Cholesterol: 116 mg/dL — ABNORMAL HIGH (ref 0–99)
NONHDL: 126.47
TRIGLYCERIDES: 54 mg/dL (ref 0.0–149.0)
VLDL: 10.8 mg/dL (ref 0.0–40.0)

## 2016-05-16 LAB — COMPREHENSIVE METABOLIC PANEL
ALT: 20 U/L (ref 0–35)
AST: 18 U/L (ref 0–37)
Albumin: 4.1 g/dL (ref 3.5–5.2)
Alkaline Phosphatase: 69 U/L (ref 39–117)
BILIRUBIN TOTAL: 0.5 mg/dL (ref 0.2–1.2)
BUN: 12 mg/dL (ref 6–23)
CALCIUM: 9.4 mg/dL (ref 8.4–10.5)
CO2: 32 meq/L (ref 19–32)
CREATININE: 0.67 mg/dL (ref 0.40–1.20)
Chloride: 105 mEq/L (ref 96–112)
GFR: 96.9 mL/min (ref >60.00)
Glucose, Bld: 115 mg/dL — ABNORMAL HIGH (ref 70–99)
Potassium: 4.7 mEq/L (ref 3.5–5.1)
Sodium: 140 mEq/L (ref 135–145)
Total Protein: 7 g/dL (ref 6.0–8.3)

## 2016-05-16 LAB — VITAMIN B12: VITAMIN B 12: 523 pg/mL (ref 211–911)

## 2016-05-16 LAB — VITAMIN D 25 HYDROXY (VIT D DEFICIENCY, FRACTURES): VITD: 18.82 ng/mL — ABNORMAL LOW (ref 30.00–100.00)

## 2016-05-16 LAB — TSH: TSH: 1.57 u[IU]/mL (ref 0.35–4.50)

## 2016-05-16 NOTE — Patient Instructions (Signed)
Health Maintenance, Female Adopting a healthy lifestyle and getting preventive care can go a long way to promote health and wellness. Talk with your health care provider about what schedule of regular examinations is right for you. This is a good chance for you to check in with your provider about disease prevention and staying healthy. In between checkups, there are plenty of things you can do on your own. Experts have done a lot of research about which lifestyle changes and preventive measures are most likely to keep you healthy. Ask your health care provider for more information. WEIGHT AND DIET  Eat a healthy diet  Be sure to include plenty of vegetables, fruits, low-fat dairy products, and lean protein.  Do not eat a lot of foods high in solid fats, added sugars, or salt.  Get regular exercise. This is one of the most important things you can do for your health.  Most adults should exercise for at least 150 minutes each week. The exercise should increase your heart rate and make you sweat (moderate-intensity exercise).  Most adults should also do strengthening exercises at least twice a week. This is in addition to the moderate-intensity exercise.  Maintain a healthy weight  Body mass index (BMI) is a measurement that can be used to identify possible weight problems. It estimates body fat based on height and weight. Your health care provider can help determine your BMI and help you achieve or maintain a healthy weight.  For females 20 years of age and older:   A BMI below 18.5 is considered underweight.  A BMI of 18.5 to 24.9 is normal.  A BMI of 25 to 29.9 is considered overweight.  A BMI of 30 and above is considered obese.  Watch levels of cholesterol and blood lipids  You should start having your blood tested for lipids and cholesterol at 56 years of age, then have this test every 5 years.  You may need to have your cholesterol levels checked more often if:  Your lipid  or cholesterol levels are high.  You are older than 56 years of age.  You are at high risk for heart disease.  CANCER SCREENING   Lung Cancer  Lung cancer screening is recommended for adults 55-80 years old who are at high risk for lung cancer because of a history of smoking.  A yearly low-dose CT scan of the lungs is recommended for people who:  Currently smoke.  Have quit within the past 15 years.  Have at least a 30-pack-year history of smoking. A pack year is smoking an average of one pack of cigarettes a day for 1 year.  Yearly screening should continue until it has been 15 years since you quit.  Yearly screening should stop if you develop a health problem that would prevent you from having lung cancer treatment.  Breast Cancer  Practice breast self-awareness. This means understanding how your breasts normally appear and feel.  It also means doing regular breast self-exams. Let your health care provider know about any changes, no matter how small.  If you are in your 20s or 30s, you should have a clinical breast exam (CBE) by a health care provider every 1-3 years as part of a regular health exam.  If you are 40 or older, have a CBE every year. Also consider having a breast X-ray (mammogram) every year.  If you have a family history of breast cancer, talk to your health care provider about genetic screening.  If you   are at high risk for breast cancer, talk to your health care provider about having an MRI and a mammogram every year.  Breast cancer gene (BRCA) assessment is recommended for women who have family members with BRCA-related cancers. BRCA-related cancers include:  Breast.  Ovarian.  Tubal.  Peritoneal cancers.  Results of the assessment will determine the need for genetic counseling and BRCA1 and BRCA2 testing. Cervical Cancer Your health care provider may recommend that you be screened regularly for cancer of the pelvic organs (ovaries, uterus, and  vagina). This screening involves a pelvic examination, including checking for microscopic changes to the surface of your cervix (Pap test). You may be encouraged to have this screening done every 3 years, beginning at age 21.  For women ages 30-65, health care providers may recommend pelvic exams and Pap testing every 3 years, or they may recommend the Pap and pelvic exam, combined with testing for human papilloma virus (HPV), every 5 years. Some types of HPV increase your risk of cervical cancer. Testing for HPV may also be done on women of any age with unclear Pap test results.  Other health care providers may not recommend any screening for nonpregnant women who are considered low risk for pelvic cancer and who do not have symptoms. Ask your health care provider if a screening pelvic exam is right for you.  If you have had past treatment for cervical cancer or a condition that could lead to cancer, you need Pap tests and screening for cancer for at least 20 years after your treatment. If Pap tests have been discontinued, your risk factors (such as having a new sexual partner) need to be reassessed to determine if screening should resume. Some women have medical problems that increase the chance of getting cervical cancer. In these cases, your health care provider may recommend more frequent screening and Pap tests. Colorectal Cancer  This type of cancer can be detected and often prevented.  Routine colorectal cancer screening usually begins at 56 years of age and continues through 56 years of age.  Your health care provider may recommend screening at an earlier age if you have risk factors for colon cancer.  Your health care provider may also recommend using home test kits to check for hidden blood in the stool.  A small camera at the end of a tube can be used to examine your colon directly (sigmoidoscopy or colonoscopy). This is done to check for the earliest forms of colorectal  cancer.  Routine screening usually begins at age 50.  Direct examination of the colon should be repeated every 5-10 years through 56 years of age. However, you may need to be screened more often if early forms of precancerous polyps or small growths are found. Skin Cancer  Check your skin from head to toe regularly.  Tell your health care provider about any new moles or changes in moles, especially if there is a change in a mole's shape or color.  Also tell your health care provider if you have a mole that is larger than the size of a pencil eraser.  Always use sunscreen. Apply sunscreen liberally and repeatedly throughout the day.  Protect yourself by wearing long sleeves, pants, a wide-brimmed hat, and sunglasses whenever you are outside. HEART DISEASE, DIABETES, AND HIGH BLOOD PRESSURE   High blood pressure causes heart disease and increases the risk of stroke. High blood pressure is more likely to develop in:  People who have blood pressure in the high end   of the normal range (130-139/85-89 mm Hg).  People who are overweight or obese.  People who are African American.  If you are 38-23 years of age, have your blood pressure checked every 3-5 years. If you are 61 years of age or older, have your blood pressure checked every year. You should have your blood pressure measured twice--once when you are at a hospital or clinic, and once when you are not at a hospital or clinic. Record the average of the two measurements. To check your blood pressure when you are not at a hospital or clinic, you can use:  An automated blood pressure machine at a pharmacy.  A home blood pressure monitor.  If you are between 45 years and 39 years old, ask your health care provider if you should take aspirin to prevent strokes.  Have regular diabetes screenings. This involves taking a blood sample to check your fasting blood sugar level.  If you are at a normal weight and have a low risk for diabetes,  have this test once every three years after 56 years of age.  If you are overweight and have a high risk for diabetes, consider being tested at a younger age or more often. PREVENTING INFECTION  Hepatitis B  If you have a higher risk for hepatitis B, you should be screened for this virus. You are considered at high risk for hepatitis B if:  You were born in a country where hepatitis B is common. Ask your health care provider which countries are considered high risk.  Your parents were born in a high-risk country, and you have not been immunized against hepatitis B (hepatitis B vaccine).  You have HIV or AIDS.  You use needles to inject street drugs.  You live with someone who has hepatitis B.  You have had sex with someone who has hepatitis B.  You get hemodialysis treatment.  You take certain medicines for conditions, including cancer, organ transplantation, and autoimmune conditions. Hepatitis C  Blood testing is recommended for:  Everyone born from [birth year] through 1966.  Anyone with known risk factors for hepatitis C. Sexually transmitted infections (STIs)  You should be screened for sexually transmitted infections (STIs) including gonorrhea and chlamydia if:  You are sexually active and are younger than 56 years of age.  You are older than 56 years of age and your health care provider tells you that you are at risk for this type of infection.  Your sexual activity has changed since you were last screened and you are at an increased risk for chlamydia or gonorrhea. Ask your health care provider if you are at risk.  If you do not have HIV, but are at risk, it may be recommended that you take a prescription medicine daily to prevent HIV infection. This is called pre-exposure prophylaxis (PrEP). You are considered at risk if:  You are sexually active and do not regularly use condoms or know the HIV status of your partner(s).  You take drugs by injection.  You are sexually  active with a partner who has HIV. Talk with your health care provider about whether you are at high risk of being infected with HIV. If you choose to begin PrEP, you should first be tested for HIV. You should then be tested every 3 months for as long as you are taking PrEP.  PREGNANCY   If you are premenopausal and you may become pregnant, ask your health care provider about preconception counseling.  If you may  become pregnant, take 400 to 800 micrograms (mcg) of folic acid every day.  If you want to prevent pregnancy, talk to your health care provider about birth control (contraception). OSTEOPOROSIS AND MENOPAUSE   Osteoporosis is a disease in which the bones lose minerals and strength with aging. This can result in serious bone fractures. Your risk for osteoporosis can be identified using a bone density scan.  If you are 61 years of age or older, or if you are at risk for osteoporosis and fractures, ask your health care provider if you should be screened.  Ask your health care provider whether you should take a calcium or vitamin D supplement to lower your risk for osteoporosis.  Menopause may have certain physical symptoms and risks.  Hormone replacement therapy may reduce some of these symptoms and risks. Talk to your health care provider about whether hormone replacement therapy is right for you.  HOME CARE INSTRUCTIONS   Schedule regular health, dental, and eye exams.  Stay current with your immunizations.   Do not use any tobacco products including cigarettes, chewing tobacco, or electronic cigarettes.  If you are pregnant, do not drink alcohol.  If you are breastfeeding, limit how much and how often you drink alcohol.  Limit alcohol intake to no more than 1 drink per day for nonpregnant women. One drink equals 12 ounces of beer, 5 ounces of wine, or 1 ounces of hard liquor.  Do not use street drugs.  Do not share needles.  Ask your health care provider for help if  you need support or information about quitting drugs.  Tell your health care provider if you often feel depressed.  Tell your health care provider if you have ever been abused or do not feel safe at home.   This information is not intended to replace advice given to you by your health care provider. Make sure you discuss any questions you have with your health care provider.   Document Released: 05/12/2011 Document Revised: 11/17/2014 Document Reviewed: 09/28/2013 Elsevier Interactive Patient Education [employer].

## 2016-05-16 NOTE — Progress Notes (Signed)
Pre visit review using our clinic review tool, if applicable. No additional management support is needed unless otherwise documented below in the visit note. 

## 2016-05-16 NOTE — Assessment & Plan Note (Signed)
General medical exam normal today. Breast exam declined. PAP and pelvic deferred as s/p hysterectomy. Labs today. Encouraged healthy diet and exercise. Declines TDAP. Mammogram UTD, next due in mid 06/2016. Pt will schedule. Referral placed for colonoscopy.

## 2016-05-16 NOTE — Progress Notes (Signed)
Subjective:    Patient ID: Mikayla Wagner, female    DOB: 03/15/1961, 55 y.o.   MRN: [id number]  HPI  55YO female presents for physical exam.  Generally feeling well. No acute concerns today.  Anxiety - Symptoms well controlled on Wellbutrin.  Wt Readings from Last 3 Encounters:  05/16/16 161 lb (73.029 kg)  05/21/15 153 lb 2 oz (69.457 kg)  05/09/14 151 lb 8 oz (68.72 kg)   BP Readings from Last 3 Encounters:  05/16/16 128/88  05/21/15 110/78  05/09/14 100/82    Past Medical History  Diagnosis Date  . Ovarian cyst, complex 2010    s/p removal  . Abnormal mammogram     2013, plan to repeat mammogram 05/2013   Family History  Problem Relation Age of Onset  . Cancer Paternal Aunt     ovarian cancer  . Breast cancer Paternal Aunt    Past Surgical History  Procedure Laterality Date  . Abdominal hysterectomy    . Partial colectomy  2005    ruptured diverticuli, s/p colostomy and reversal  . Cesarean section      4x  . Appendectomy    . Colon surgery    . Scar revision  2011  . Breast enhancement surgery  2006  . Dilation and curettage of uterus  2005  . Augmentation mammaplasty Bilateral 2006   Social History   Social History  . Marital Status: Married    Spouse Name: N/A  . Number of Children: N/A  . Years of Education: N/A   Social History Main Topics  . Smoking status: Never Smoker   . Smokeless tobacco: Never Used  . Alcohol Use: Yes     Comment: socially  . Drug Use: No  . Sexual Activity: Not Asked   Other Topics Concern  . None   Social History Narrative   Works at [employer] in [place]. Lives in [place] with 15YO son. Cat. Exercises regularly. Diet regular.    Review of Systems  Constitutional: Negative for fever, chills, appetite change, fatigue and unexpected weight change.  Eyes: Negative for visual disturbance.  Respiratory: Negative for cough and shortness of breath.   Cardiovascular: Negative for chest pain and leg swelling.    Gastrointestinal: Negative for nausea, vomiting, abdominal pain, diarrhea and constipation.  Skin: Negative for color change and rash.  Hematological: Negative for adenopathy. Does not bruise/bleed easily.  Psychiatric/Behavioral: Negative for suicidal ideas, sleep disturbance and dysphoric mood. The patient is not nervous/anxious.        Objective:    BP 128/88 mmHg  Pulse 82  Ht 5\' 2"  (1.575 m)  Wt 161 lb (73.029 kg)  BMI 29.44 kg/m2  SpO2 98% Physical Exam  Constitutional: She is oriented to person, place, and time. She appears well-developed and well-nourished. No distress.  HENT:  Head: Normocephalic and atraumatic.  Right Ear: External ear normal.  Left Ear: External ear normal.  Nose: Nose normal.  Mouth/Throat: Oropharynx is clear and moist. No oropharyngeal exudate.  Eyes: Conjunctivae and EOM are normal. Pupils are equal, round, and reactive to light. Right eye exhibits no discharge.  Neck: Normal range of motion. Neck supple. No thyromegaly present.  Cardiovascular: Normal rate, regular rhythm, normal heart sounds and intact distal pulses.  Exam reveals no gallop and no friction rub.   No murmur heard. Pulmonary/Chest: Effort normal. No respiratory distress. She has no wheezes. She has no rales.  Abdominal: Soft. Bowel sounds are normal. She exhibits no distension and no  mass. There is no tenderness. There is no rebound and no guarding.  Musculoskeletal: Normal range of motion. She exhibits no edema or tenderness.  Lymphadenopathy:    She has no cervical adenopathy.  Neurological: She is alert and oriented to person, place, and time. No cranial nerve deficit. Coordination normal.  Skin: Skin is warm and dry. No rash noted. She is not diaphoretic. No erythema. No pallor.  Psychiatric: She has a normal mood and affect. Her behavior is normal. Judgment and thought content normal.          Assessment & Plan:   Problem List Items Addressed This Visit       Unprioritized   Anxiety   Routine general medical examination at a health care facility - Primary    General medical exam normal today. Breast exam declined. PAP and pelvic deferred as s/p hysterectomy. Labs today. Encouraged healthy diet and exercise. Declines TDAP. Mammogram UTD, next due in mid 06/2016. Pt will schedule. Referral placed for colonoscopy.      Relevant Orders   TSH   CBC with Differential/Platelet   Comprehensive metabolic panel   Lipid panel   VITAMIN D 25 Hydroxy (Vit-D Deficiency, Fractures)   B12   Hepatitis C antibody   Special screening for malignant neoplasms, colon   Relevant Orders   Ambulatory referral to General Surgery       Return in about 6 months (around 11/16/2016) for New Patient.  Ronette Deter, MD Internal [facility]

## 2016-05-17 LAB — HEPATITIS C ANTIBODY: HCV Ab: NEGATIVE

## 2016-05-19 ENCOUNTER — Encounter: Payer: Self-pay | Admitting: Internal Medicine

## 2016-05-19 MED ORDER — ALPRAZOLAM 1 MG PO TABS: ORAL_TABLET | ORAL | Status: DC

## 2016-05-19 MED ORDER — BUPROPION HCL 100 MG PO TABS: 100.0000 mg | ORAL_TABLET | Freq: Every day | ORAL | Status: DC

## 2016-05-19 NOTE — Telephone Encounter (Signed)
Please advise if these are okay to refill, thanks

## 2016-05-23 ENCOUNTER — Encounter: Payer: Self-pay | Admitting: Internal Medicine

## 2016-05-23 ENCOUNTER — Other Ambulatory Visit: Payer: Self-pay | Admitting: Internal Medicine

## 2016-05-26 ENCOUNTER — Encounter: Payer: Self-pay | Admitting: Internal Medicine

## 2016-06-05 ENCOUNTER — Encounter: Payer: Self-pay | Admitting: Internal Medicine

## 2016-06-07 ENCOUNTER — Encounter: Payer: Self-pay | Admitting: Internal Medicine

## 2016-06-19 ENCOUNTER — Other Ambulatory Visit: Payer: Self-pay | Admitting: Internal Medicine

## 2016-06-20 ENCOUNTER — Other Ambulatory Visit: Payer: Self-pay

## 2016-06-20 MED ORDER — ALPRAZOLAM 1 MG PO TABS: ORAL_TABLET | ORAL | 0 refills | Status: DC

## 2016-06-20 NOTE — Telephone Encounter (Signed)
Refill request for Xanax, last seen [id number], last filled [id number].  Please advise.

## 2016-06-23 ENCOUNTER — Telehealth: Payer: Self-pay | Admitting: Family

## 2016-06-23 NOTE — Telephone Encounter (Signed)
Assessed [place] Controlled Substance Reporting System and did not see suspicious activity at this time under patient's name and address in Epic.   UTD on visits.

## 2016-06-23 NOTE — Telephone Encounter (Signed)
Patient has been informed of RX.  If any issues she will call back.

## 2016-06-23 NOTE — Telephone Encounter (Signed)
We'll you call patient and ensure she got her prescription for Xanax. I'm confused about whether or not he went to the pharmacy electronically or not and dont want to refill twice.

## 2016-06-24 ENCOUNTER — Encounter: Payer: Self-pay | Admitting: Family

## 2016-06-24 DIAGNOSIS — F411 Generalized anxiety disorder: Secondary | ICD-10-CM

## 2016-06-24 NOTE — Telephone Encounter (Signed)
Did you call in or fax?

## 2016-06-24 NOTE — Telephone Encounter (Signed)
Left message for return call.

## 2016-06-24 NOTE — Telephone Encounter (Signed)
Assessed [place] Controlled Substance Reporting System-unable provider giving recalls. CMA to call and verify with patient.

## 2016-06-24 NOTE — Telephone Encounter (Signed)
Patient has been informed and has scheduled appointment [phone]

## 2016-06-24 NOTE — Telephone Encounter (Signed)
Pt called back stating that the Rx did not go through. Pharmacy advised to do a verbal call in. Thank you!  Pharmacy is [id number] - [place], [place].   Call pt @ [phone].

## 2016-06-25 ENCOUNTER — Telehealth: Payer: Self-pay

## 2016-06-25 ENCOUNTER — Ambulatory Visit: Payer: Medicaid Other | Admitting: Family

## 2016-06-25 ENCOUNTER — Encounter: Payer: Self-pay | Admitting: Family

## 2016-06-25 ENCOUNTER — Other Ambulatory Visit: Payer: Self-pay | Admitting: Internal Medicine

## 2016-06-25 DIAGNOSIS — Z1231 Encounter for screening mammogram for malignant neoplasm of breast: Secondary | ICD-10-CM

## 2016-06-25 NOTE — Telephone Encounter (Signed)
Spoken to patient about refill on medication.  Informed patient that she will need to be seen by Joycelyn Schmid arnett before any refills.  Patient understood and has scheduled appointment on [id number] @1100 

## 2016-06-25 NOTE — Telephone Encounter (Signed)
Yesterday, spoke to patient about refill of Xanax.  Told patient she needed appointment before further refills.  I scheduled appointment for today @1100 .  Patient has canceled appointment stating she does not need appointment.

## 2016-07-11 ENCOUNTER — Telehealth: Payer: Self-pay | Admitting: General Surgery

## 2016-07-11 NOTE — Telephone Encounter (Signed)
07-11-16 @9 :56 I CALLED PT & L/M TO RETURN CALL.WE HAVE A REF IN THE SYSTEM FROM DR Ronette Deter TO [facility] A SCREENING COLONOSCOPY.I HAD L/M MESSAGES FOR PT VIA PHONE & MY CHART IN July.THIS CALL WAS A REMINDER TO CALL us AND SCHEDULE OR LET us KNOW SHE'S NOT INTERESTED IN SCHEDULING  AT THIS TIME.

## 2016-07-16 NOTE — Telephone Encounter (Signed)
PT HAS AN APPT WITH Dr. Bary Castilla for 08-27-16/mth

## 2016-07-17 ENCOUNTER — Encounter: Payer: Self-pay | Admitting: Family

## 2016-07-17 ENCOUNTER — Ambulatory Visit (INDEPENDENT_AMBULATORY_CARE_PROVIDER_SITE_OTHER): Payer: 59 | Admitting: Family

## 2016-07-17 VITALS — BP 118/88 | HR 85 | Temp 97.7°F | Ht 62.0 in | Wt 159.4 lb

## 2016-07-17 DIAGNOSIS — F419 Anxiety disorder, unspecified: Secondary | ICD-10-CM | POA: Diagnosis not present

## 2016-07-17 DIAGNOSIS — Z8542 Personal history of malignant neoplasm of other parts of uterus: Secondary | ICD-10-CM | POA: Diagnosis not present

## 2016-07-17 DIAGNOSIS — F411 Generalized anxiety disorder: Secondary | ICD-10-CM

## 2016-07-17 MED ORDER — ALPRAZOLAM 1 MG PO TABS: ORAL_TABLET | ORAL | 0 refills | Status: DC

## 2016-07-17 MED ORDER — SERTRALINE HCL 50 MG PO TABS: 50.0000 mg | ORAL_TABLET | Freq: Every day | ORAL | 3 refills | Status: DC

## 2016-07-17 MED ORDER — BUPROPION HCL ER (SR) 100 MG PO TB12: 100.0000 mg | ORAL_TABLET | Freq: Two times a day (BID) | ORAL | 3 refills | Status: DC

## 2016-07-17 NOTE — Progress Notes (Signed)
Subjective:    Patient ID: Mikayla Wagner, female    DOB: Sep 14, 1961, 55 y.o.   MRN: [id number]  CC: Mikayla Wagner is a 55 y.o. female who presents today for follow up.   HPI: Patient here for follow-up on chronic disease and to establish care.   Depression/Anxiety: She states that she does not think the Wellbutrin is working well and like this to be perhaps SR which has worked in past for energy.  Has tried celexa, cymbalta, and paxil which not work well and caused GI issues. Takes xanax twice a day for anxiety mostly related to work stress at [employer]. Usually takes at bedtime and perhaps half a tablet at work. No thoughts of hurting herself or anyone else.      Due for Tdap, defers today. Last  Pap was normal with ( Dr. Sabra Heck [facility] oncology), done 2013. H/o uterine cyst s/p hysterectomy, oophorectomy. 'doesnt think she needs Pap anymore' per Dr. Sabra Heck.   Has consult scheduled for colonoscopy.   Recent labs 05/2016.    HISTORY:  Past Medical History:  Diagnosis Date  . Abnormal mammogram    2013, plan to repeat mammogram 05/2013  . Ovarian cyst, complex 2010   s/p removal   Past Surgical History:  Procedure Laterality Date  . ABDOMINAL HYSTERECTOMY    . APPENDECTOMY    . AUGMENTATION MAMMAPLASTY Bilateral 2006  . BREAST ENHANCEMENT SURGERY  2006  . CESAREAN SECTION     4x  . COLON SURGERY    . DILATION AND CURETTAGE OF UTERUS  2005  . PARTIAL COLECTOMY  2005   ruptured diverticuli, s/p colostomy and reversal  . SCAR REVISION  2011   Family History  Problem Relation Age of Onset  . Cancer Paternal Aunt     ovarian cancer  . Breast cancer Paternal Aunt     Allergies: Codeine; Erythromycin; Morphine sulfate; and Latex Current Outpatient Prescriptions on File Prior to Visit  Medication Sig Dispense Refill  . hyoscyamine (LEVSIN, ANASPAZ) 0.125 MG tablet TAKE 1 TABLET (0.125 MG TOTAL) BY MOUTH EVERY 4 (FOUR) HOURS AS NEEDED FOR CRAMPING. 60 tablet 0  . ibuprofen  (ADVIL,MOTRIN) 800 MG tablet Take 800 mg by mouth every 8 (eight) hours as needed.      . nitrofurantoin, macrocrystal-monohydrate, (MACROBID) 100 MG capsule Take 1 capsule (100 mg total) by mouth at bedtime. 30 capsule 0   Current Facility-Administered Medications on File Prior to Visit  Medication Dose Route Frequency Provider Last Rate Last Dose  . cyanocobalamin ((VITAMIN B-12)) injection 1,000 mcg  1,000 mcg Intramuscular Once Jackolyn Confer, MD        Social History  Substance Use Topics  . Smoking status: Never Smoker  . Smokeless tobacco: Never Used  . Alcohol use Yes     Comment: socially    Review of Systems  Constitutional: Negative for chills and fever.  Respiratory: Negative for cough.   Cardiovascular: Negative for chest pain and palpitations.  Gastrointestinal: Negative for nausea and vomiting.  Psychiatric/Behavioral: Negative for self-injury and suicidal ideas. The patient is nervous/anxious.       Objective:    BP 118/88   Pulse 85   Temp 97.7 F (36.5 C) (Oral)   Ht 5\' 2"  (1.575 m)   Wt 159 lb 6.4 oz (72.3 kg)   SpO2 98%   BMI 29.15 kg/m  BP Readings from Last 3 Encounters:  07/17/16 118/88  05/16/16 128/88  05/21/15 110/78   Wt  Readings from Last 3 Encounters:  07/17/16 159 lb 6.4 oz (72.3 kg)  05/16/16 161 lb (73 kg)  05/21/15 153 lb 2 oz (69.5 kg)    Physical Exam  Constitutional: She appears well-developed and well-nourished.  Eyes: Conjunctivae are normal.  Cardiovascular: Normal rate, regular rhythm, normal heart sounds and normal pulses.   Pulmonary/Chest: Effort normal and breath sounds normal. She has no wheezes. She has no rhonchi. She has no rales.  Neurological: She is alert.  Skin: Skin is warm and dry.  Psychiatric: She has a normal mood and affect. Her speech is normal and behavior is normal. Thought content normal.  Vitals reviewed.      Assessment & Plan:   Problem List Items Addressed This Visit      Genitourinary     History of uterine cancer    Unable to see conversion notes from 07/2102 from Dr. Sabra Heck however unable to read notes.Per patient, Last  pap was normal with ( Dr. Sabra Heck [facility] oncology), done 2013. H/o uterine cyst s/p hysterectomy, oophorectomy. Discussed with patient that I would advise her to be sure of any confusion and discuss with Dr. Sabra Heck what her recommendations were for ongoing pap and if she no longer needed to do them. I offered to do a pap at [facility] and patient politely declined today.       Relevant Medications   ALPRAZolam (XANAX) 1 MG tablet     Other   Anxiety    We jointly agreed her anxiety not well controlled on current regimen. We discussed risks of BZDs. I would like her to be on a safer regimen. We decided to start titrating down on Xanax with the goal being to either use less or a lower dose. We started Zoloft in hopes to manage her anxiety more effectively. We also discussed Wellbutrin may be aggravating/activating anxiety, and we will consider titrating this down at future visits.  Refilled one month supply of xanax and discussed with patient that she is not to fill at other pharmacies OR with other prescribers as she has done in the past. Patient verbalized understanding. New CSC.       Relevant Medications   buPROPion (WELLBUTRIN SR) 100 MG 12 hr tablet   sertraline (ZOLOFT) 50 MG tablet   ALPRAZolam (XANAX) 1 MG tablet    Other Visit Diagnoses    Anxiety state    -  Primary   Relevant Medications   buPROPion (WELLBUTRIN SR) 100 MG 12 hr tablet   sertraline (ZOLOFT) 50 MG tablet   ALPRAZolam (XANAX) 1 MG tablet        I have discontinued Ms. Finklea's buPROPion. I am also having her start on buPROPion and sertraline. Additionally, I am having her maintain her ibuprofen, nitrofurantoin (macrocrystal-monohydrate), hyoscyamine, and ALPRAZolam. We will continue to administer cyanocobalamin.   Meds ordered this encounter  Medications  . buPROPion (WELLBUTRIN  SR) 100 MG 12 hr tablet    Sig: Take 1 tablet (100 mg total) by mouth 2 (two) times daily.    Dispense:  30 tablet    Refill:  3    Order Specific Question:   Supervising Provider    Answer:   Deborra Medina L [[id number]]  . sertraline (ZOLOFT) 50 MG tablet    Sig: Take 1 tablet (50 mg total) by mouth at bedtime.    Dispense:  90 tablet    Refill:  3    Order Specific Question:   Supervising Provider  Answer:   TULLO, TERESA L [[id number]]  . DISCONTD: ALPRAZolam (XANAX) 1 MG tablet    Sig: TAKE 1 TABLET BY MOUTH TWICE A DAY AS NEEDED FOR SLEEP OR ANXIETY    Dispense:  30 tablet    Refill:  0    This request is for a new prescription for a controlled substance as required by Federal/State law.    Order Specific Question:   Supervising Provider    Answer:   Crecencio Mc [[id number]]  . ALPRAZolam (XANAX) 1 MG tablet    Sig: TAKE 1 TABLET BY MOUTH TWICE A DAY AS NEEDED FOR SLEEP OR ANXIETY    Dispense:  60 tablet    Refill:  0    This request is for a new prescription for a controlled substance as required by Federal/State law.    Order Specific Question:   Supervising Provider    Answer:   Crecencio Mc [[id number]]    Return precautions given.   Risks, benefits, and alternatives of the medications and treatment plan prescribed today were discussed, and patient expressed understanding.   Education regarding symptom management and diagnosis given to patient on AVS.  Continue to follow with Mable Paris, FNP for routine health maintenance.   Alden Server and I agreed with plan.   Mable Paris, FNP Total of 25 minutes spent with patient, greater than 50% of which was spent in discussion of  Depression, anxiety, and risks of BZDs.

## 2016-07-17 NOTE — Progress Notes (Signed)
Pre visit review using our clinic review tool, if applicable. No additional management support is needed unless otherwise documented below in the visit note. 

## 2016-07-17 NOTE — Patient Instructions (Addendum)
Today you may start alternating between 1 tablet twice a day xanax as you have been doing and then alternate with 1 tablet once a day one week.  The following week, take 1 tablet daily.   The following week, take 1/2 tablet (0.50 mg) of xanax at bedtime.   The following week take 1/2 tablet (0.50 mg) of xanax every other day.  The following week take 1/2 tablet (0.50 mg) of xanax every third day.   At this point as long and as you feel okay, you may stop the medication. Please give [place] a call during this titration to let us know how you're doing.   Let us know how you are on Zoloft.   We should also consider if Wellbutrin SR is not improving energy and depression titrating off of medication as may worsen anxiety.

## 2016-07-17 NOTE — Assessment & Plan Note (Addendum)
Unable to see conversion notes from 07/2102 from Dr. Sabra Heck however unable to read notes.Per patient, Last  pap was normal with ( Dr. Sabra Heck [facility] oncology), done 2013. H/o uterine cyst s/p hysterectomy, oophorectomy. Discussed with patient that I would advise her to be sure of any confusion and discuss with Dr. Sabra Heck what her recommendations were for ongoing pap and if she no longer needed to do them. I offered to do a pap at [facility] and patient politely declined today.

## 2016-07-17 NOTE — Assessment & Plan Note (Addendum)
We jointly agreed her anxiety not well controlled on current regimen. We discussed risks of BZDs. I would like her to be on a safer regimen. We decided to start titrating down on Xanax with the goal being to either use less or a lower dose. We started Zoloft in hopes to manage her anxiety more effectively. We also discussed Wellbutrin may be aggravating/activating anxiety, and we will consider titrating this down at future visits.  Refilled one month supply of xanax and discussed with patient that she is not to fill at other pharmacies OR with other prescribers as she has done in the past. Patient verbalized understanding. New CSC.

## 2016-07-18 ENCOUNTER — Encounter: Payer: Self-pay | Admitting: Family

## 2016-07-22 ENCOUNTER — Ambulatory Visit: Payer: Medicaid Other

## 2016-07-23 ENCOUNTER — Encounter: Payer: Self-pay | Admitting: Family

## 2016-07-25 ENCOUNTER — Telehealth: Payer: Self-pay

## 2016-07-25 NOTE — Telephone Encounter (Signed)
Left message for patient to schedule appointment for her sx.

## 2016-08-19 ENCOUNTER — Telehealth: Payer: Self-pay

## 2016-08-19 NOTE — Telephone Encounter (Signed)
Refill request for Hyoscyamine, last seen [id number], last filled [place] .  Please advise.

## 2016-08-21 ENCOUNTER — Encounter: Payer: Self-pay | Admitting: Family

## 2016-08-24 ENCOUNTER — Other Ambulatory Visit: Payer: Self-pay | Admitting: Family

## 2016-08-24 DIAGNOSIS — K589 Irritable bowel syndrome without diarrhea: Secondary | ICD-10-CM

## 2016-08-24 MED ORDER — HYOSCYAMINE SULFATE 0.125 MG PO TABS: ORAL_TABLET | ORAL | 0 refills | Status: DC

## 2016-08-27 ENCOUNTER — Encounter: Payer: Self-pay | Admitting: Family

## 2016-08-27 ENCOUNTER — Ambulatory Visit: Payer: Self-pay | Admitting: General Surgery

## 2016-08-27 ENCOUNTER — Encounter: Payer: Self-pay | Admitting: *Deleted

## 2016-08-28 ENCOUNTER — Telehealth: Payer: Self-pay

## 2016-08-28 ENCOUNTER — Encounter: Payer: Self-pay | Admitting: Family

## 2016-08-28 DIAGNOSIS — F411 Generalized anxiety disorder: Secondary | ICD-10-CM

## 2016-08-28 MED ORDER — BUPROPION HCL ER (SR) 100 MG PO TB12: 100.0000 mg | ORAL_TABLET | Freq: Two times a day (BID) | ORAL | 1 refills | Status: DC

## 2016-08-28 NOTE — Telephone Encounter (Signed)
Medication refilled

## 2016-08-28 NOTE — Telephone Encounter (Signed)
Medication has been refilled.

## 2016-08-29 ENCOUNTER — Encounter: Payer: Self-pay | Admitting: Family

## 2016-08-29 ENCOUNTER — Other Ambulatory Visit: Payer: Self-pay

## 2016-08-29 ENCOUNTER — Other Ambulatory Visit: Payer: Self-pay | Admitting: Family

## 2016-08-29 DIAGNOSIS — F411 Generalized anxiety disorder: Secondary | ICD-10-CM

## 2016-08-29 MED ORDER — BUPROPION HCL ER (SR) 100 MG PO TB12: 100.0000 mg | ORAL_TABLET | Freq: Two times a day (BID) | ORAL | 2 refills | Status: DC

## 2016-08-29 MED ORDER — BUPROPION HCL ER (SR) 100 MG PO TB12: 100.0000 mg | ORAL_TABLET | Freq: Two times a day (BID) | ORAL | 1 refills | Status: DC

## 2016-08-29 NOTE — Telephone Encounter (Signed)
Re sent medication script. Due to wrong amount of tablets.

## 2016-08-29 NOTE — Telephone Encounter (Signed)
Pt called back about the pharmacy not approving the medication. Pt states that she takes 2 a day so the Rx needs to reflect the amount of pills for the dosage. Please advise?  Call pt @ [phone]. Thank you!  [facility] is [id number] [place] [address], [place]

## 2016-09-22 ENCOUNTER — Encounter: Payer: Self-pay | Admitting: General Surgery

## 2016-09-22 ENCOUNTER — Ambulatory Visit (INDEPENDENT_AMBULATORY_CARE_PROVIDER_SITE_OTHER): Payer: 59 | Admitting: General Surgery

## 2016-09-22 VITALS — BP 130/70 | HR 72 | Resp 12 | Ht 62.0 in | Wt 159.0 lb

## 2016-09-22 DIAGNOSIS — Z1211 Encounter for screening for malignant neoplasm of colon: Secondary | ICD-10-CM | POA: Diagnosis not present

## 2016-09-22 MED ORDER — ESTROGENS, CONJUGATED 0.625 MG/GM VA CREA: 0.5000 | TOPICAL_CREAM | Freq: Every day | VAGINAL | 12 refills | Status: DC

## 2016-09-22 MED ORDER — POLYETHYLENE GLYCOL 3350 17 GM/SCOOP PO POWD: ORAL | 0 refills | Status: DC

## 2016-09-22 NOTE — Progress Notes (Signed)
Patient ID: Mikayla Wagner, female   DOB: 1961/08/23, 55 y.o.   MRN: [id number]  Chief Complaint  Patient presents with  . Colonoscopy    HPI Mikayla Wagner is a 55 y.o. female here today for a screening colonoscopy. Last colonoscopy was in 2005. Patient states no GI problem at this time.      The patient was last seen in annular 2014 Re: New microcalcifications. She subsequently underwent serial exams with resolution of the concern with the radiology department. Biopsy was not required.  I personally reviewed the patient's history.                   eHPI  Past Medical History:  Diagnosis Date  . Abnormal mammogram    2013, plan to repeat mammogram 05/2013  . Ovarian cyst, complex 2010   s/p removal    Past Surgical History:  Procedure Laterality Date  . ABDOMINAL HYSTERECTOMY    . APPENDECTOMY    . AUGMENTATION MAMMAPLASTY Bilateral 2006  . BREAST ENHANCEMENT SURGERY  2006  . CESAREAN SECTION     4x  . COLON SURGERY    . DILATION AND CURETTAGE OF UTERUS  2005  . PARTIAL COLECTOMY  2005   ruptured diverticuli, s/p colostomy and reversal  . SCAR REVISION  2011    Family History  Problem Relation Age of Onset  . Cancer Paternal Aunt     ovarian cancer  . Breast cancer Paternal Aunt     Social History Social History  Substance Use Topics  . Smoking status: Never Smoker  . Smokeless tobacco: Never Used  . Alcohol use Yes     Comment: socially    Allergies  Allergen Reactions  . Codeine Nausea Only  . Erythromycin     Nausea and vomiting  . Morphine Sulfate Nausea And Vomiting  . Latex Rash    Current Outpatient Prescriptions  Medication Sig Dispense Refill  . ALPRAZolam (XANAX) 1 MG tablet TAKE 1 TABLET BY MOUTH TWICE A DAY AS NEEDED FOR SLEEP OR ANXIETY 60 tablet 0  . buPROPion (WELLBUTRIN SR) 100 MG 12 hr tablet Take 1 tablet (100 mg total) by mouth 2 (two) times daily. 180 tablet 2  . hyoscyamine (LEVSIN, ANASPAZ) 0.125 MG tablet TAKE 1 TABLET (0.125  MG TOTAL) BY MOUTH EVERY 4 (FOUR) HOURS AS NEEDED FOR CRAMPING. 60 tablet 0  . ibuprofen (ADVIL,MOTRIN) 800 MG tablet Take 800 mg by mouth every 8 (eight) hours as needed.      . nitrofurantoin, macrocrystal-monohydrate, (MACROBID) 100 MG capsule Take 1 capsule (100 mg total) by mouth at bedtime. 30 capsule 0  . conjugated estrogens (PREMARIN) vaginal cream Place 0.5 Applicatorfuls vaginally daily. 42.5 g 12  . polyethylene glycol powder (GLYCOLAX/MIRALAX) powder 255 grams one bottle for colonoscopy prep 255 g 0   Current Facility-Administered Medications  Medication Dose Route Frequency Provider Last Rate Last Dose  . cyanocobalamin ((VITAMIN B-12)) injection 1,000 mcg  1,000 mcg Intramuscular Once Mikayla Confer, MD        Review of Systems Review of Systems  Constitutional: Negative.   Respiratory: Negative.   Cardiovascular: Negative.   Genitourinary: Positive for dyspareunia.    Blood pressure 130/70, pulse 72, resp. rate 12, height 5\' 2"  (1.575 m), weight 159 lb (72.1 kg).  Physical Exam Physical Exam  Constitutional: She is oriented to person, place, and time. She appears well-developed and well-nourished.  Cardiovascular: Normal rate, regular rhythm and normal heart sounds.   Pulmonary/Chest:  Effort normal and breath sounds normal.  Neurological: She is alert and oriented to person, place, and time.  Skin: Skin is warm and dry.    Data Reviewed 09/06/2009 operative report from [facility] for a large multiloculated pelvic mass was reviewed. The patient had a mass filling the pelvis measuring 12 x 14 cm. Extensive intra-abdominal adhesions were recorded. At that time a small abscess adjacent to the cecum was drained, 10 mL of purulent reported. The sigmoid colon is reported to be densely adherent to the lateral pelvic sidewall and the right side. A blind loop near the previous anastomotic site (status post colostomy takedown) was resected with 5-6 cm of colon  removed. This did not involve redoing the previous anastomosis.  Pathology showed peritoneal cyst, benign lymph nodes, abscess cavity and no pathologic diagnosis for the tubes and uterus and ovaries.  Assessment    Candidate for screening colonoscopy.    Plan    The patient has had extensive intra-abdominal surgery. With sigmoid colectomy hopefully her colonoscopy unusually tortuous.    Colonoscopy with possible biopsy/polypectomy prn: Information regarding the procedure, including its potential risks and complications (including but not limited to perforation of the bowel, which may require emergency surgery to repair, and bleeding) was verbally given to the patient. Educational information regarding lower intestinal endoscopy was given to the patient. Written instructions for how to complete the bowel prep using Miralax were provided. The importance of drinking ample fluids to avoid dehydration as a result of the prep emphasized.  Patient has been scheduled for a colonoscopy on 10-22-16 at [facility].   This information has been scribed by Gaspar Cola CMA.   Robert Bellow 09/22/2016, 9:21 PM

## 2016-09-22 NOTE — Patient Instructions (Signed)
Colonoscopy A colonoscopy is an exam to look at the entire large intestine (colon). This exam can help find problems such as tumors, polyps, inflammation, and areas of bleeding. The exam takes about 1 hour.  LET YOUR HEALTH CARE PROVIDER KNOW ABOUT:   Any allergies you have.  All medicines you are taking, including vitamins, herbs, eye drops, creams, and over-the-counter medicines.  Previous problems you or members of your family have had with the use of anesthetics.  Any blood disorders you have.  Previous surgeries you have had.  Medical conditions you have. RISKS AND COMPLICATIONS  Generally, this is a safe procedure. However, as with any procedure, complications can occur. Possible complications include:  Bleeding.  Tearing or rupture of the colon wall.  Reaction to medicines given during the exam.  Infection (rare). BEFORE THE PROCEDURE   Ask your health care provider about changing or stopping your regular medicines.  You may be prescribed an oral bowel prep. This involves drinking a large amount of medicated liquid, starting the day before your procedure. The liquid will cause you to have multiple loose stools until your stool is almost clear or light green. This cleans out your colon in preparation for the procedure.  Do not eat or drink anything else once you have started the bowel prep, unless your health care provider tells you it is safe to do so.  Arrange for someone to drive you home after the procedure. PROCEDURE   You will be given medicine to help you relax (sedative).  You will lie on your side with your knees bent.  A long, flexible tube with a light and camera on the end (colonoscope) will be inserted through the rectum and into the colon. The camera sends video back to a computer screen as it moves through the colon. The colonoscope also releases carbon dioxide gas to inflate the colon. This helps your health care provider see the area better.  During  the exam, your health care provider may take a small tissue sample (biopsy) to be examined under a microscope if any abnormalities are found.  The exam is finished when the entire colon has been viewed. AFTER THE PROCEDURE   Do not drive for 24 hours after the exam.  You may have a small amount of blood in your stool.  You may pass moderate amounts of gas and have mild abdominal cramping or bloating. This is caused by the gas used to inflate your colon during the exam.  Ask when your test results will be ready and how you will get your results. Make sure you get your test results.   This information is not intended to replace advice given to you by your health care provider. Make sure you discuss any questions you have with your health care provider.   Document Released: 10/24/2000 Document Revised: 08/17/2013 Document Reviewed: 07/04/2013 Elsevier Interactive Patient Education 2016 Elsevier Inc.  

## 2016-09-23 ENCOUNTER — Telehealth: Payer: Self-pay | Admitting: *Deleted

## 2016-09-23 NOTE — Telephone Encounter (Signed)
OK to use Estrace cream

## 2016-09-23 NOTE — Telephone Encounter (Signed)
Insurance will not approve Premarin cream but will approve  Estrace cream, is that substitution appropriate?

## 2016-09-24 ENCOUNTER — Encounter: Payer: Self-pay | Admitting: *Deleted

## 2016-09-24 MED ORDER — ESTRADIOL 0.1 MG/GM VA CREA: 0.5000 | TOPICAL_CREAM | Freq: Every day | VAGINAL | 12 refills | Status: DC

## 2016-09-24 NOTE — Addendum Note (Signed)
Addended by: Carson Myrtle on: 09/24/2016 08:09 AM   Modules accepted: Orders

## 2016-09-25 ENCOUNTER — Encounter: Payer: Self-pay | Admitting: Family

## 2016-10-21 ENCOUNTER — Telehealth: Payer: Self-pay

## 2016-10-21 NOTE — Telephone Encounter (Signed)
Patient called to reschedule her colonoscopy scheduled for 10/22/16. The patient is scheduled for a Colonoscopy at [facility] on 12/10/16. They are aware to call the day before to get their arrival time. Miralax prescription has been sent into the patient's pharmacy. The patient is aware of date and instructions. Endoscopy has been notified of the change.

## 2016-11-14 ENCOUNTER — Telehealth: Payer: Self-pay

## 2016-11-14 ENCOUNTER — Encounter: Payer: Self-pay | Admitting: Family

## 2016-11-14 ENCOUNTER — Other Ambulatory Visit: Payer: Self-pay | Admitting: Family

## 2016-11-14 DIAGNOSIS — K589 Irritable bowel syndrome without diarrhea: Secondary | ICD-10-CM

## 2016-11-14 MED ORDER — HYOSCYAMINE SULFATE 0.125 MG PO TABS: ORAL_TABLET | ORAL | 0 refills | Status: DC

## 2016-11-14 NOTE — Telephone Encounter (Signed)
Medication has been refilled.

## 2016-12-03 ENCOUNTER — Encounter: Payer: Self-pay | Admitting: General Surgery

## 2016-12-09 ENCOUNTER — Encounter: Payer: Self-pay | Admitting: *Deleted

## 2016-12-10 ENCOUNTER — Ambulatory Visit
Admission: RE | Admit: 2016-12-10 | Discharge: 2016-12-10 | Disposition: A | Discharge status: Home / Self Care | Payer: 59 | Source: Ambulatory Visit | Attending: General Surgery | Admitting: General Surgery

## 2016-12-10 ENCOUNTER — Encounter: Payer: Self-pay | Admitting: *Deleted

## 2016-12-10 ENCOUNTER — Ambulatory Visit: Payer: 59 | Admitting: Anesthesiology

## 2016-12-10 ENCOUNTER — Encounter
Admission: RE | Disposition: A | Discharge status: Home / Self Care | Payer: Self-pay | Source: Ambulatory Visit | Attending: General Surgery

## 2016-12-10 DIAGNOSIS — Z79899 Other long term (current) drug therapy: Secondary | ICD-10-CM | POA: Diagnosis not present

## 2016-12-10 DIAGNOSIS — Z1211 Encounter for screening for malignant neoplasm of colon: Secondary | ICD-10-CM | POA: Insufficient documentation

## 2016-12-10 DIAGNOSIS — D649 Anemia, unspecified: Secondary | ICD-10-CM | POA: Insufficient documentation

## 2016-12-10 DIAGNOSIS — K5790 Diverticulosis of intestine, part unspecified, without perforation or abscess without bleeding: Secondary | ICD-10-CM | POA: Diagnosis not present

## 2016-12-10 DIAGNOSIS — K5730 Diverticulosis of large intestine without perforation or abscess without bleeding: Secondary | ICD-10-CM | POA: Diagnosis not present

## 2016-12-10 HISTORY — PX: COLONOSCOPY WITH PROPOFOL: SHX5780

## 2016-12-10 SURGERY — COLONOSCOPY WITH PROPOFOL
Anesthesia: General

## 2016-12-10 MED ORDER — FENTANYL CITRATE (PF) 100 MCG/2ML IJ SOLN
INTRAMUSCULAR | Status: AC
  Filled 2016-12-10: qty 2

## 2016-12-10 MED ORDER — SODIUM CHLORIDE 0.9 % IV SOLN: INTRAVENOUS | Status: DC

## 2016-12-10 MED ORDER — LIDOCAINE HCL (PF) 2 % IJ SOLN
INTRAMUSCULAR | Status: AC
  Filled 2016-12-10: qty 2

## 2016-12-10 MED ORDER — MIDAZOLAM HCL 2 MG/2ML IJ SOLN
INTRAMUSCULAR | Status: AC
  Filled 2016-12-10: qty 2

## 2016-12-10 MED ORDER — PROPOFOL 10 MG/ML IV BOLUS
INTRAVENOUS | Status: DC | PRN
  Administered 2016-12-10: 30 mg via INTRAVENOUS
  Administered 2016-12-10: 40 mg via INTRAVENOUS

## 2016-12-10 MED ORDER — PROPOFOL 10 MG/ML IV BOLUS
INTRAVENOUS | Status: AC
  Filled 2016-12-10: qty 20

## 2016-12-10 MED ORDER — PROPOFOL 500 MG/50ML IV EMUL
INTRAVENOUS | Status: DC | PRN
  Administered 2016-12-10: 120 ug/kg/min via INTRAVENOUS

## 2016-12-10 NOTE — Anesthesia Post-op Follow-up Note (Signed)
Anesthesia QCDR form completed.        

## 2016-12-10 NOTE — Anesthesia Preprocedure Evaluation (Signed)
Anesthesia Evaluation  Patient identified by MRN, date of birth, ID band Patient awake    Reviewed: Allergy & Precautions, NPO status , Patient's Chart, lab work & pertinent test results  Airway Mallampati: II       Dental  (+) Teeth Intact   Pulmonary neg pulmonary ROS,    breath sounds clear to auscultation       Cardiovascular Exercise Tolerance: Good  Rhythm:Regular     Neuro/Psych    GI/Hepatic negative GI ROS, Neg liver ROS,   Endo/Other  negative endocrine ROS  Renal/GU negative Renal ROS     Musculoskeletal   Abdominal (+) + obese,   Peds  Hematology  (+) anemia ,   Anesthesia Other Findings   Reproductive/Obstetrics                             Anesthesia Physical Anesthesia Plan  ASA: II  Anesthesia Plan: General   Post-op Pain Management:    Induction: Intravenous  Airway Management Planned: Natural Airway and Nasal Cannula  Additional Equipment:   Intra-op Plan:   Post-operative Plan:   Informed Consent: I have reviewed the patients History and Physical, chart, labs and discussed the procedure including the risks, benefits and alternatives for the proposed anesthesia with the patient or authorized representative who has indicated his/her understanding and acceptance.     Plan Discussed with: Surgeon  Anesthesia Plan Comments:         Anesthesia Quick Evaluation

## 2016-12-10 NOTE — H&P (Signed)
Mikayla Wagner [id number] 05-Apr-1961     HPI: Healthy 56 year old woman for screening colonoscopy. Past history notable for diverticulitis with resection and temporary colostomy in 2005. Last colonoscopy was prior to takedown of her colostomy. No recent GI symptoms. She tolerated the prep fairly well.  Facility-Administered Medications Prior to Admission  Medication Dose Route Frequency Provider Last Rate Last Dose  . cyanocobalamin ((VITAMIN B-12)) injection 1,000 mcg  1,000 mcg Intramuscular Once Jackolyn Confer, MD       Prescriptions Prior to Admission  Medication Sig Dispense Refill Last Dose  . buPROPion (WELLBUTRIN SR) 100 MG 12 hr tablet Take 1 tablet (100 mg total) by mouth 2 (two) times daily. 180 tablet 2 Taking  . hyoscyamine (LEVSIN, ANASPAZ) 0.125 MG tablet TAKE 1 TABLET (0.125 MG TOTAL) BY MOUTH EVERY 4 (FOUR) HOURS AS NEEDED FOR CRAMPING. 60 tablet 0   . ibuprofen (ADVIL,MOTRIN) 800 MG tablet Take 800 mg by mouth every 8 (eight) hours as needed.     Taking  . nitrofurantoin, macrocrystal-monohydrate, (MACROBID) 100 MG capsule Take 1 capsule (100 mg total) by mouth at bedtime. 30 capsule 0 Taking  . polyethylene glycol powder (GLYCOLAX/MIRALAX) powder 255 grams one bottle for colonoscopy prep 255 g 0   . [DISCONTINUED] ALPRAZolam (XANAX) 1 MG tablet TAKE 1 TABLET BY MOUTH TWICE A DAY AS NEEDED FOR SLEEP OR ANXIETY 60 tablet 0 Taking  . [DISCONTINUED] conjugated estrogens (PREMARIN) vaginal cream Place 0.5 Applicatorfuls vaginally daily. 42.5 g 12   . [DISCONTINUED] estradiol (ESTRACE VAGINAL) 0.1 MG/GM vaginal cream Place 0.5 Applicatorfuls vaginally at bedtime. 42.5 g 12    Allergies  Allergen Reactions  . Codeine Nausea Only  . Erythromycin     Nausea and vomiting  . Morphine Sulfate Nausea And Vomiting  . Latex Rash   Past Medical History:  Diagnosis Date  . Abnormal mammogram    2013, plan to repeat mammogram 05/2013  . Ovarian cyst, complex 2010   s/p removal    Past Surgical History:  Procedure Laterality Date  . ABDOMINAL HYSTERECTOMY    . APPENDECTOMY    . AUGMENTATION MAMMAPLASTY Bilateral 2006  . BREAST ENHANCEMENT SURGERY  2006  . CESAREAN SECTION     4x  . COLON SURGERY    . DILATION AND CURETTAGE OF UTERUS  2005  . PARTIAL COLECTOMY  2005   ruptured diverticuli, s/p colostomy and reversal  . SCAR REVISION  2011   Social History   Social History  . Marital status: Married    Spouse name: N/A  . Number of children: N/A  . Years of education: N/A   Occupational History  . Not on file.   Social History Main Topics  . Smoking status: Never Smoker  . Smokeless tobacco: Never Used  . Alcohol use Yes     Comment: socially  . Drug use: No  . Sexual activity: Not on file   Other Topics Concern  . Not on file   Social History Narrative   Works at [employer].    Divorced.   Lives in [place] with 15YO son. Cat. Exercises regularly. Diet regular.   Has 3 daughter and one son. One daughter has CP.       Both parents died in airline crash and she was raised by her older sister.          Social History   Social History Narrative   Works at [employer].    Divorced.   Lives in [place] with [place]  son. Cat. Exercises regularly. Diet regular.   Has 3 daughter and one son. One daughter has CP.       Both parents died in airline crash and she was raised by her older sister.            ROS: Negative.     PE: HEENT: Negative. Lungs: Clear. Cardio: RR. Assessment/Plan:  Proceed with planned endoscopy.    Robert Bellow 12/10/2016

## 2016-12-10 NOTE — Anesthesia Postprocedure Evaluation (Signed)
Anesthesia Post Note  Patient: Mikayla Wagner  Procedure(s) Performed: Procedure(s) (LRB): COLONOSCOPY WITH PROPOFOL (N/A)  Patient location during evaluation: PACU Anesthesia Type: General Level of consciousness: awake Pain management: pain level controlled Vital Signs Assessment: post-procedure vital signs reviewed and stable Respiratory status: nonlabored ventilation Cardiovascular status: stable Anesthetic complications: no     Last Vitals:  Vitals:   12/10/16 0947 12/10/16 0957  BP: 105/74 105/68  Pulse: 65 63  Resp: 11 13  Temp:      Last Pain:  Vitals:   12/10/16 0927  TempSrc: Tympanic                 VAN STAVEREN,Lorey Pallett

## 2016-12-10 NOTE — Transfer of Care (Signed)
Immediate Anesthesia Transfer of Care Note  Patient: Mikayla Wagner  Procedure(s) Performed: Procedure(s): COLONOSCOPY WITH PROPOFOL (N/A)  Patient Location: PACU  Anesthesia Type:General  Level of Consciousness: awake  Airway & Oxygen Therapy: Patient Spontanous Breathing and Patient connected to nasal cannula oxygen  Post-op Assessment: Report given to RN and Post -op Vital signs reviewed and stable  Post vital signs: Reviewed  Last Vitals:  Vitals:   12/10/16 0827  BP: 134/68  Pulse: 80  Resp: 16  Temp: 36.5 C    Last Pain:  Vitals:   12/10/16 0827  TempSrc: Tympanic         Complications: No apparent anesthesia complications

## 2016-12-10 NOTE — Op Note (Signed)
[facility] Patient Name: Mikayla Wagner Procedure Date: 12/10/2016 8:59 AM MRN: [id number] Account #: [id number] Date of Birth: [birth year] Admit Type: Outpatient Age: 56 Room: [facility] ENDO ROOM 1 Gender: Female Note Status: Finalized Procedure:            Colonoscopy Indications:          Screening for colorectal malignant neoplasm Providers:            Robert Bellow, MD Referring MD:         Yvetta Coder. Arnett (Referring MD) Medicines:            Monitored Anesthesia Care Complications:        No immediate complications. Procedure:            Pre-Anesthesia Assessment:                       - Prior to the procedure, a History and Physical was                        performed, and patient medications, allergies and                        sensitivities were reviewed. The patient's tolerance of                        previous anesthesia was reviewed.                       - The risks and benefits of the procedure and the                        sedation options and risks were discussed with the                        patient. All questions were answered and informed                        consent was obtained.                       After obtaining informed consent, the colonoscope was                        passed under direct vision. Throughout the procedure,                        the patient's blood pressure, pulse, and oxygen                        saturations were monitored continuously. The                        Colonoscope was introduced through the anus and                        advanced to the the terminal ileum. The colonoscopy was                        somewhat difficult due to previous sigmoid colectomy w/  end to side anastomosis. . Findings:      A few large-mouthed diverticula were found in the descending colon and       transverse colon.      The terminal ileum appeared normal.      The retroflexed view  of the distal rectum and anal verge was normal and       showed no anal or rectal abnormalities. Impression:           - Diverticulosis in the descending colon and in the                        transverse colon.                       - The examined portion of the ileum was normal.                       - The distal rectum and anal verge are normal on                        retroflexion view.                       - No specimens collected. Recommendation:       - Repeat colonoscopy in 10 years for screening purposes. Procedure Code(s):    --- Professional ---                       [phone], Colonoscopy, flexible; diagnostic, including                        collection of specimen(s) by brushing or washing, when                        performed (separate procedure) Diagnosis Code(s):    --- Professional ---                       Z12.11, Encounter for screening for malignant neoplasm                        of colon                       K57.30, Diverticulosis of large intestine without                        perforation or abscess without bleeding CPT copyright 2016 American Medical Association. All rights reserved. The codes documented in this report are preliminary and upon coder review may  be revised to meet current compliance requirements. Robert Bellow, MD 12/10/2016 9:25:48 AM This report has been signed electronically. Number of Addenda: 0 Note Initiated On: 12/10/2016 8:59 AM Scope Withdrawal Time: 0 hours 8 minutes 36 seconds  Total Procedure Duration: 0 hours 16 minutes 57 seconds       [facility]

## 2016-12-11 ENCOUNTER — Encounter: Payer: Self-pay | Admitting: General Surgery

## 2017-01-16 ENCOUNTER — Encounter: Payer: Self-pay | Admitting: Family

## 2017-01-19 ENCOUNTER — Other Ambulatory Visit: Payer: Self-pay | Admitting: Family

## 2017-01-19 DIAGNOSIS — K589 Irritable bowel syndrome without diarrhea: Secondary | ICD-10-CM

## 2017-01-19 MED ORDER — HYOSCYAMINE SULFATE 0.125 MG PO TABS: ORAL_TABLET | ORAL | 1 refills | Status: DC

## 2017-03-03 ENCOUNTER — Other Ambulatory Visit: Payer: Self-pay | Admitting: Internal Medicine

## 2017-03-04 MED ORDER — NITROFURANTOIN MONOHYD MACRO 100 MG PO CAPS: 100.0000 mg | ORAL_CAPSULE | Freq: Every day | ORAL | 0 refills | Status: DC

## 2017-09-02 ENCOUNTER — Telehealth: Payer: Self-pay | Admitting: Family

## 2017-09-02 DIAGNOSIS — Z0000 Encounter for general adult medical examination without abnormal findings: Secondary | ICD-10-CM

## 2017-09-02 NOTE — Telephone Encounter (Signed)
Left message for patient to return call  Back.

## 2017-09-02 NOTE — Telephone Encounter (Signed)
Call pt  reviewing chart  Pt is due for  Mammogram  I have ordered; please ensure she schedules

## 2017-09-02 NOTE — Telephone Encounter (Signed)
Pt called back returning your call. I informed pt that she is due for a mammo. Pt states she will call to sch. Thank you!

## 2017-11-08 ENCOUNTER — Other Ambulatory Visit: Payer: Self-pay | Admitting: Family

## 2017-11-15 ENCOUNTER — Other Ambulatory Visit: Payer: Self-pay | Admitting: Family

## 2017-11-19 ENCOUNTER — Ambulatory Visit
Admission: RE | Admit: 2017-11-19 | Discharge: 2017-11-19 | Disposition: A | Discharge status: Home / Self Care | Payer: 59 | Source: Ambulatory Visit | Attending: Family | Admitting: Family

## 2017-11-19 DIAGNOSIS — Z1231 Encounter for screening mammogram for malignant neoplasm of breast: Secondary | ICD-10-CM | POA: Insufficient documentation

## 2017-11-19 DIAGNOSIS — Z0000 Encounter for general adult medical examination without abnormal findings: Secondary | ICD-10-CM

## 2017-12-29 ENCOUNTER — Ambulatory Visit: Payer: 59 | Admitting: Family

## 2017-12-30 ENCOUNTER — Ambulatory Visit (INDEPENDENT_AMBULATORY_CARE_PROVIDER_SITE_OTHER): Payer: 59 | Admitting: Family

## 2017-12-30 ENCOUNTER — Encounter: Payer: Self-pay | Admitting: Family

## 2017-12-30 VITALS — BP 130/82 | HR 74 | Temp 97.8°F | Wt 157.1 lb

## 2017-12-30 DIAGNOSIS — N390 Urinary tract infection, site not specified: Secondary | ICD-10-CM

## 2017-12-30 DIAGNOSIS — E663 Overweight: Secondary | ICD-10-CM | POA: Diagnosis not present

## 2017-12-30 MED ORDER — BUPROPION HCL ER (XL) 300 MG PO TB24: 300.0000 mg | ORAL_TABLET | Freq: Every morning | ORAL | 1 refills | Status: DC

## 2017-12-30 MED ORDER — NITROFURANTOIN MONOHYD MACRO 100 MG PO CAPS: 100.0000 mg | ORAL_CAPSULE | Freq: Every day | ORAL | 0 refills | Status: DC

## 2017-12-30 NOTE — Assessment & Plan Note (Signed)
Congratulated patient on weight loss. Discussed diet. Will trial increase of wellbutrin to 300mg  in XL formulation. F/u 3 months, advised to return for CPE.

## 2017-12-30 NOTE — Patient Instructions (Addendum)
Aim for one pound of weight loss per week.   You are doing AWESOME-keep it up.   Start wellbutrin 300mg  XL and see if helps.   This is  Dr. Lupita Dawn  example of a  "Low GI"  Diet:  It will allow you to lose 4 to 8  lbs  per month if you follow it carefully.  Your goal with exercise is a minimum of 30 minutes of aerobic exercise 5 days per week (Walking does not count once it becomes easy!)    All of the foods can be found at grocery stores and in bulk at [employer].  The Atkins protein bars and shakes are available in more varieties at [employer], [employer] and [employer].     7 AM Breakfast:  Choose from the following:  Low carbohydrate Protein  Shakes (I recommend the  Premier Protein chocolate shakes,  EAS AdvantEdge "Carb Control" shakes  Or the Atkins shakes all are under 3 net carbs)     a scrambled egg/bacon/cheese burrito made with Mission's "carb balance" whole wheat tortilla  (about 10 net carbs )  Regulatory affairs officer (basically a quiche without the pastry crust) that is eaten cold and very convenient way to get your eggs.  8 carbs)  If you make your own protein shakes, avoid bananas and pineapple,  And use low carb greek yogurt or original /unsweetened almond or soy milk    Avoid cereal and bananas, oatmeal and cream of wheat and grits. They are loaded with carbohydrates!   10 AM: high protein snack:  Protein bar by Atkins (the snack size, under 200 cal, usually < 6 net carbs).    A stick of cheese:  Around 1 carb,  100 cal     Dannon Light n Fit [place] Yogurt  (80 cal, 8 carbs)  Other so called "protein bars" and Greek yogurts tend to be loaded with carbohydrates.  Remember, in food advertising, the word "energy" is synonymous for " carbohydrate."  Lunch:   A Sandwich using the bread choices listed, Can use any  Eggs,  lunchmeat, grilled meat or canned tuna), avocado, regular mayo/mustard  and cheese.  A Salad using blue cheese, ranch,  Goddess or vinagrette,   Avoid taco shells, croutons or "confetti" and no "candied nuts" but regular nuts OK.   No pretzels, nabs  or chips.  Pickles and miniature sweet peppers are a good low carb alternative that provide a "crunch"  The bread is the only source of carbohydrate in a sandwich and  can be decreased by trying some of the attached alternatives to traditional loaf bread   Avoid "Low fat dressings, as well as [place] dressings They are loaded with sugar!   3 PM/ Mid day  Snack:  Consider  1 ounce of  almonds, walnuts, pistachios, pecans, peanuts,  Macadamia nuts or a nut medley.  Avoid "granola and granola bars "  Mixed nuts are ok in moderation as long as there are no raisins,  cranberries or dried fruit.   KIND bars are OK if you get the low glycemic index variety   Try the prosciutto/mozzarella cheese sticks by F[employer]ci  In deli /backery section   High protein      6 PM  Dinner:     Meat/fowl/fish with a green salad, and either broccoli, cauliflower, green beans, spinach, brussel sprouts or  Lima beans. DO NOT BREAD THE PROTEIN!!      There  is a low carb pasta by Dreamfield's that is acceptable and tastes great: only 5 digestible carbs/serving.( All grocery stores but BJs carry it ) Several ready made meals are available low carb:   Try Michel Angelo's chicken piccata or chicken or eggplant parm over low carb pasta.(Lowes and BJs)   Marjory Lies Sanchez's "Carnitas" (pulled pork, no sauce,  0 carbs) or his beef pot roast to make a dinner burrito (at BJ's)  Pesto over low carb pasta (bj's sells a good quality pesto in the center refrigerated section of the deli   Try satueeing  Cheral Marker with mushroooms as a good side   [employer] Giant makes a mashed cauliflower that tastes like mashed potatoes  Whole wheat pasta is still full of digestible carbs and  Not as low in glycemic index as Dreamfield's.   Brown rice is still rice,  So skip the rice and noodles if you eat [place] or [place] (or at  least limit to 1/2 cup)  9 PM snack :   Breyer's "low carb" fudgsicle or  ice cream bar (Carb Smart line), or  Weight Watcher's ice cream bar , or another "no sugar added" ice cream;  a serving of fresh berries/cherries with whipped cream   Cheese or DANNON'S LlGHT N FIT GREEK YOGURT  8 ounces of [place] unsweetened almond/cococunut milk    Treat yourself to a parfait made with whipped cream blueberiies, walnuts and vanilla greek yogurt  Avoid bananas, pineapple, grapes  and watermelon on a regular basis because they are high in sugar.  THINK OF THEM AS DESSERT  Remember that snack Substitutions should be less than 10 NET carbs per serving and meals < 20 carbs. Remember to subtract fiber grams to get the "net carbs."  @TULLOBREADPACKAGE @

## 2017-12-30 NOTE — Progress Notes (Signed)
Subjective:    Patient ID: Mikayla Wagner, female    DOB: 03/19/1961, 57 y.o.   MRN: [id number]  CC: Mikayla Wagner is a 57 y.o. female who presents today for follow up.   HPI: Anxiety and depression- has been on wellbutrin for over a year, taking 100mg  SR only QD. Not sure if helping. No longer in bad relationship and doing well. No thoughts of hurting herself or anyone else.    One month ago, started a diet and has lost 6 pounds. Decreased sugary drinks. Has not phentermine in the past.   For years has taken macrobid after sex to prevent UTI. Would like refill. No hematuria, dysuria.       HISTORY:  Past Medical History:  Diagnosis Date  . Abnormal mammogram    2013, plan to repeat mammogram 05/2013  . Ovarian cyst, complex 2010   s/p removal   Past Surgical History:  Procedure Laterality Date  . ABDOMINAL HYSTERECTOMY    . APPENDECTOMY    . AUGMENTATION MAMMAPLASTY Bilateral 2006   saline  . BREAST ENHANCEMENT SURGERY  2006  . CESAREAN SECTION     4x  . COLON SURGERY    . COLONOSCOPY WITH PROPOFOL N/A 12/10/2016   Procedure: COLONOSCOPY WITH PROPOFOL;  Surgeon: Robert Bellow, MD;  Location: [facility] ENDOSCOPY;  Service: Endoscopy;  Laterality: N/A;  . DILATION AND CURETTAGE OF UTERUS  2005  . PARTIAL COLECTOMY  2005   ruptured diverticuli, s/p colostomy and reversal  . SCAR REVISION  2011   Family History  Problem Relation Age of Onset  . Cancer Paternal Aunt        ovarian cancer  . Breast cancer Paternal Aunt     Allergies: Codeine; Erythromycin; Morphine sulfate; and Latex Current Outpatient Medications on File Prior to Visit  Medication Sig Dispense Refill  . hyoscyamine (LEVSIN, ANASPAZ) 0.125 MG tablet TAKE 1 TABLET (0.125 MG TOTAL) BY MOUTH ONCE DAILY AS NEEDED FOR CRAMPING. 90 tablet 1  . ibuprofen (ADVIL,MOTRIN) 800 MG tablet Take 800 mg by mouth every 8 (eight) hours as needed.       Current Facility-Administered Medications on File Prior to Visit    Medication Dose Route Frequency Provider Last Rate Last Dose  . cyanocobalamin ((VITAMIN B-12)) injection 1,000 mcg  1,000 mcg Intramuscular Once Jackolyn Confer, MD        Social History   Tobacco Use  . Smoking status: Never Smoker  . Smokeless tobacco: Never Used  Substance Use Topics  . Alcohol use: Yes    Comment: socially  . Drug use: No    Review of Systems  Constitutional: Negative for chills and fever.  Respiratory: Negative for cough.   Cardiovascular: Negative for chest pain and palpitations.  Gastrointestinal: Negative for nausea and vomiting.  Genitourinary: Negative for dysuria, frequency, hematuria and urgency.  Psychiatric/Behavioral: Negative for suicidal ideas.      Objective:    BP 130/82 (BP Location: Left Arm, Patient Position: Sitting, Cuff Size: Normal)   Pulse 74   Temp 97.8 F (36.6 C) (Oral)   Wt 157 lb 2 oz (71.3 kg)   SpO2 99%   BMI 28.74 kg/m  BP Readings from Last 3 Encounters:  12/30/17 130/82  12/10/16 105/68  09/22/16 130/70   Wt Readings from Last 3 Encounters:  12/30/17 157 lb 2 oz (71.3 kg)  12/10/16 155 lb (70.3 kg)  09/22/16 159 lb (72.1 kg)    Physical Exam  Constitutional: She  appears well-developed and well-nourished.  Eyes: Conjunctivae are normal.  Cardiovascular: Normal rate, regular rhythm, normal heart sounds and normal pulses.  Pulmonary/Chest: Effort normal and breath sounds normal. She has no wheezes. She has no rhonchi. She has no rales.  Neurological: She is alert.  Skin: Skin is warm and dry.  Psychiatric: She has a normal mood and affect. Her speech is normal and behavior is normal. Thought content normal.  Vitals reviewed.      Assessment & Plan:   Problem List Items Addressed This Visit      Genitourinary   Chronic UTI    Continue post coital macrobid. Refilled.       Relevant Medications   nitrofurantoin, macrocrystal-monohydrate, (MACROBID) 100 MG capsule     Other   Overweight (BMI  25.0-29.9) - Primary    Congratulated patient on weight loss. Discussed diet. Will trial increase of wellbutrin to 300mg  in XL formulation. F/u 3 months, advised to return for CPE.       Relevant Medications   buPROPion (WELLBUTRIN XL) 300 MG 24 hr tablet       I have discontinued Audrie Gallus. Sanker's buPROPion. I am also having her start on buPROPion. Additionally, I am having her maintain her ibuprofen, hyoscyamine, and nitrofurantoin (macrocrystal-monohydrate). We will continue to administer cyanocobalamin.   Meds ordered this encounter  Medications  . buPROPion (WELLBUTRIN XL) 300 MG 24 hr tablet    Sig: Take 1 tablet (300 mg total) by mouth every morning.    Dispense:  90 tablet    Refill:  1    Order Specific Question:   Supervising Provider    Answer:   Deborra Medina L [[id number]]  . nitrofurantoin, macrocrystal-monohydrate, (MACROBID) 100 MG capsule    Sig: Take 1 capsule (100 mg total) by mouth at bedtime.    Dispense:  30 capsule    Refill:  0    Order Specific Question:   Supervising Provider    Answer:   Crecencio Mc [[id number]]    Return precautions given.   Risks, benefits, and alternatives of the medications and treatment plan prescribed today were discussed, and patient expressed understanding.   Education regarding symptom management and diagnosis given to patient on AVS.  Continue to follow with Burnard Hawthorne, FNP for routine health maintenance.   Alden Server and I agreed with plan.   Mable Paris, FNP

## 2017-12-30 NOTE — Assessment & Plan Note (Signed)
Continue post coital macrobid. Refilled.

## 2018-01-24 DIAGNOSIS — J029 Acute pharyngitis, unspecified: Secondary | ICD-10-CM | POA: Diagnosis not present

## 2018-02-05 ENCOUNTER — Ambulatory Visit (INDEPENDENT_AMBULATORY_CARE_PROVIDER_SITE_OTHER): Payer: 59 | Admitting: Family

## 2018-02-05 ENCOUNTER — Encounter: Payer: Self-pay | Admitting: Family

## 2018-02-05 VITALS — BP 124/74 | HR 84 | Temp 97.9°F | Resp 16 | Ht 61.5 in | Wt 151.1 lb

## 2018-02-05 DIAGNOSIS — Z0000 Encounter for general adult medical examination without abnormal findings: Secondary | ICD-10-CM | POA: Diagnosis not present

## 2018-02-05 DIAGNOSIS — E663 Overweight: Secondary | ICD-10-CM

## 2018-02-05 NOTE — Assessment & Plan Note (Addendum)
Clinical breast exam performed today.  Pending dexa. Patient declines pelvic exam in the absence of symptoms.  She also has history of hysterectomy and no longer does Pap smears.  Referral placed to dermatology for annual skin check as well as evaluation of new mole on chest.  Patient will let me know about tetanus vaccine.

## 2018-02-05 NOTE — Patient Instructions (Signed)
Unable to print- Epic down.

## 2018-02-05 NOTE — Progress Notes (Signed)
Subjective:    Patient ID: Mikayla Wagner, female    DOB: 1961/08/25, 57 y.o.   MRN: [id number]  CC: [name] is a 57 y.o. female who presents today for physical exam.    HPI: Doing well today.  No complaints.  She continues to lose weight.  Tolerated increased dose of welbutrin well.  She does note a new mole on her chest.  History of precancerous cells.  Unsure if squamous or basal.  She also would like B12 including her labs.    Colorectal Cancer Screening: UTD 2018 Breast Cancer Screening: Mammogram UTD Cervical Cancer Screening: History of hysterectomy.  No pelvic complaints today. No longer does pap smear.  Bone Health screening/DEXA for 65+: History of osteopenia.  Will repeat DEXA Lung Cancer Screening: Doesn't have 30 year pack year history and age > 23 years.       Tetanus - thinks utd; will let me know        HIV Screening- Candidate for  Labs: Screening labs today. Exercise: Gets regular exercise.  Alcohol use: occasional Smoking/tobacco use: Nonsmoker.  Regular dental exams: utd Wears seat belt: Yes. Skin;  HISTORY:  Past Medical History:  Diagnosis Date  . Abnormal mammogram    2013, plan to repeat mammogram 05/2013  . Ovarian cyst, complex 2010   s/p removal    Past Surgical History:  Procedure Laterality Date  . ABDOMINAL HYSTERECTOMY    . APPENDECTOMY    . AUGMENTATION MAMMAPLASTY Bilateral 2006   saline  . BREAST ENHANCEMENT SURGERY  2006  . CESAREAN SECTION     4x  . COLON SURGERY    . COLONOSCOPY WITH PROPOFOL N/A 12/10/2016   Procedure: COLONOSCOPY WITH PROPOFOL;  Surgeon: Robert Bellow, MD;  Location: [facility] ENDOSCOPY;  Service: Endoscopy;  Laterality: N/A;  . DILATION AND CURETTAGE OF UTERUS  2005  . PARTIAL COLECTOMY  2005   ruptured diverticuli, s/p colostomy and reversal  . SCAR REVISION  2011   Family History  Problem Relation Age of Onset  . Cancer Paternal Aunt        ovarian cancer  . Breast cancer Paternal Aunt        ALLERGIES: Codeine; Erythromycin; Morphine sulfate; and Latex  Current Outpatient Medications on File Prior to Visit  Medication Sig Dispense Refill  . buPROPion (WELLBUTRIN XL) 300 MG 24 hr tablet Take 1 tablet (300 mg total) by mouth every morning. 90 tablet 1  . hyoscyamine (LEVSIN, ANASPAZ) 0.125 MG tablet TAKE 1 TABLET (0.125 MG TOTAL) BY MOUTH ONCE DAILY AS NEEDED FOR CRAMPING. 90 tablet 1  . ibuprofen (ADVIL,MOTRIN) 800 MG tablet Take 800 mg by mouth every 8 (eight) hours as needed.      . nitrofurantoin, macrocrystal-monohydrate, (MACROBID) 100 MG capsule Take 1 capsule (100 mg total) by mouth at bedtime. 30 capsule 0   Current Facility-Administered Medications on File Prior to Visit  Medication Dose Route Frequency Provider Last Rate Last Dose  . cyanocobalamin ((VITAMIN B-12)) injection 1,000 mcg  1,000 mcg Intramuscular Once Jackolyn Confer, MD        Social History   Tobacco Use  . Smoking status: Never Smoker  . Smokeless tobacco: Never Used  Substance Use Topics  . Alcohol use: Yes    Comment: socially  . Drug use: No    Review of Systems  Constitutional: Negative for chills, fever and unexpected weight change.  HENT: Negative for congestion.   Respiratory: Negative for cough.  Cardiovascular: Negative for chest pain, palpitations and leg swelling.  Gastrointestinal: Negative for nausea and vomiting.  Genitourinary: Negative for dysuria and pelvic pain.  Musculoskeletal: Negative for arthralgias and myalgias.  Skin: Positive for color change. Negative for rash.  Neurological: Negative for headaches.  Hematological: Negative for adenopathy.  Psychiatric/Behavioral: Negative for confusion.      Objective:    BP 124/74 (BP Location: Left Arm, Patient Position: Sitting, Cuff Size: Normal)   Pulse 84   Temp 97.9 F (36.6 C) (Oral)   Resp 16   Ht 5' 1.5" (1.562 m)   Wt 151 lb 2 oz (68.5 kg)   SpO2 97%   BMI 28.09 kg/m   BP Readings from Last 3  Encounters:  02/05/18 124/74  12/30/17 130/82  12/10/16 105/68   Wt Readings from Last 3 Encounters:  02/05/18 151 lb 2 oz (68.5 kg)  12/30/17 157 lb 2 oz (71.3 kg)  12/10/16 155 lb (70.3 kg)    Physical Exam  Constitutional: She appears well-developed and well-nourished.  Eyes: Conjunctivae are normal.  Neck: No thyroid mass and no thyromegaly present.  Cardiovascular: Normal rate, regular rhythm, normal heart sounds and normal pulses.  Pulmonary/Chest: Effort normal and breath sounds normal. She has no wheezes. She has no rhonchi. She has no rales. Right breast exhibits no inverted nipple, no mass, no nipple discharge, no skin change and no tenderness. Left breast exhibits no inverted nipple, no mass, no nipple discharge, no skin change and no tenderness. Breasts are symmetrical.  CBE performed.   Lymphadenopathy:       Head (right side): No submental, no submandibular, no tonsillar, no preauricular, no posterior auricular and no occipital adenopathy present.       Head (left side): No submental, no submandibular, no tonsillar, no preauricular, no posterior auricular and no occipital adenopathy present.    She has no cervical adenopathy.       Right cervical: No superficial cervical, no deep cervical and no posterior cervical adenopathy present.      Left cervical: No superficial cervical, no deep cervical and no posterior cervical adenopathy present.    She has no axillary adenopathy.  Neurological: She is alert.  Skin: Skin is warm and dry.     Nevi noted as marked on diagram  Psychiatric: She has a normal mood and affect. Her speech is normal and behavior is normal. Thought content normal.  Vitals reviewed.      Assessment & Plan:   Problem List Items Addressed This Visit      Other   Overweight (BMI 25.0-29.9)    Congratulated patient again on weight loss.  We will continue to follow      Routine general medical examination at a health care facility - Primary     Clinical breast exam performed today.  Pending dexa. Patient declines pelvic exam in the absence of symptoms.  She also has history of hysterectomy and no longer does Pap smears.  Referral placed to dermatology for annual skin check as well as evaluation of new mole on chest.  Patient will let me know about tetanus vaccine.       Relevant Orders   CBC with Differential/Platelet   Comprehensive metabolic panel   Hemoglobin A1c   Lipid panel   TSH   VITAMIN D 25 Hydroxy (Vit-D Deficiency, Fractures)   B12 and Folate Panel   Ambulatory referral to Dermatology       I am having Audrie Gallus. Tiley maintain her ibuprofen,  hyoscyamine, buPROPion, and nitrofurantoin (macrocrystal-monohydrate). We will continue to administer cyanocobalamin.   No orders of the defined types were placed in this encounter.   Return precautions given.   Risks, benefits, and alternatives of the medications and treatment plan prescribed today were discussed, and patient expressed understanding.   Education regarding symptom management and diagnosis given to patient on AVS.   Continue to follow with Burnard Hawthorne, FNP for routine health maintenance.   Alden Server and I agreed with plan.   Mable Paris, FNP

## 2018-02-05 NOTE — Assessment & Plan Note (Signed)
Congratulated patient again on weight loss.  We will continue to follow

## 2018-02-08 ENCOUNTER — Other Ambulatory Visit (INDEPENDENT_AMBULATORY_CARE_PROVIDER_SITE_OTHER): Payer: 59

## 2018-02-08 DIAGNOSIS — Z0000 Encounter for general adult medical examination without abnormal findings: Secondary | ICD-10-CM

## 2018-02-08 LAB — COMPREHENSIVE METABOLIC PANEL
ALT: 16 U/L (ref 0–35)
AST: 17 U/L (ref 0–37)
Albumin: 3.9 g/dL (ref 3.5–5.2)
Alkaline Phosphatase: 55 U/L (ref 39–117)
BUN: 11 mg/dL (ref 6–23)
CHLORIDE: 102 meq/L (ref 96–112)
CO2: 30 meq/L (ref 19–32)
Calcium: 9.1 mg/dL (ref 8.4–10.5)
Creatinine, Ser: 0.7 mg/dL (ref 0.40–1.20)
GFR: 91.55 mL/min (ref >60.00)
GLUCOSE: 119 mg/dL — AB (ref 70–99)
POTASSIUM: 3.7 meq/L (ref 3.5–5.1)
Sodium: 139 mEq/L (ref 135–145)
TOTAL PROTEIN: 7.1 g/dL (ref 6.0–8.3)
Total Bilirubin: 0.5 mg/dL (ref 0.2–1.2)

## 2018-02-08 LAB — LIPID PANEL
CHOLESTEROL: 182 mg/dL (ref 0–200)
HDL: 56.1 mg/dL (ref >39.00)
LDL CALC: 111 mg/dL — AB (ref 0–99)
NONHDL: 125.43
Total CHOL/HDL Ratio: 3
Triglycerides: 71 mg/dL (ref 0.0–149.0)
VLDL: 14.2 mg/dL (ref 0.0–40.0)

## 2018-02-08 LAB — CBC WITH DIFFERENTIAL/PLATELET
BASOS PCT: 0.8 % (ref 0.0–3.0)
Basophils Absolute: 0.1 10*3/uL (ref 0.0–0.1)
EOS PCT: 1.9 % (ref 0.0–5.0)
Eosinophils Absolute: 0.1 10*3/uL (ref 0.0–0.7)
HCT: 43.4 % (ref 36.0–46.0)
Hemoglobin: 14.4 g/dL (ref 12.0–15.0)
LYMPHS ABS: 1.8 10*3/uL (ref 0.7–4.0)
Lymphocytes Relative: 23.7 % (ref 12.0–46.0)
MCHC: 33.2 g/dL (ref 30.0–36.0)
MCV: 82.8 fl (ref 78.0–100.0)
MONO ABS: 0.4 10*3/uL (ref 0.1–1.0)
MONOS PCT: 5.8 % (ref 3.0–12.0)
NEUTROS ABS: 5.1 10*3/uL (ref 1.4–7.7)
NEUTROS PCT: 67.8 % (ref 43.0–77.0)
Platelets: 299 10*3/uL (ref 150.0–400.0)
RBC: 5.24 Mil/uL — ABNORMAL HIGH (ref 3.87–5.11)
RDW: 14.9 % (ref 11.5–15.5)
WBC: 7.5 10*3/uL (ref 4.0–10.5)

## 2018-02-08 LAB — B12 AND FOLATE PANEL: Folate: 22.4 ng/mL (ref >5.9)

## 2018-02-08 LAB — HEMOGLOBIN A1C: HEMOGLOBIN A1C: 5.8 % (ref 4.6–6.5)

## 2018-02-08 LAB — TSH: TSH: 2.51 u[IU]/mL (ref 0.35–4.50)

## 2018-02-08 LAB — VITAMIN D 25 HYDROXY (VIT D DEFICIENCY, FRACTURES): VITD: 20.44 ng/mL — ABNORMAL LOW (ref 30.00–100.00)

## 2018-02-11 ENCOUNTER — Other Ambulatory Visit: Payer: Self-pay | Admitting: Family

## 2018-02-11 ENCOUNTER — Encounter: Payer: Self-pay | Admitting: Family

## 2018-02-12 ENCOUNTER — Encounter: Payer: Self-pay | Admitting: Family

## 2018-03-22 ENCOUNTER — Other Ambulatory Visit: Payer: Self-pay | Admitting: Family

## 2018-03-22 DIAGNOSIS — E663 Overweight: Secondary | ICD-10-CM

## 2018-04-08 ENCOUNTER — Encounter: Payer: Self-pay | Admitting: Family

## 2018-05-12 ENCOUNTER — Encounter: Payer: Self-pay | Admitting: Family

## 2018-05-17 ENCOUNTER — Other Ambulatory Visit: Payer: Self-pay | Admitting: Family

## 2018-05-17 DIAGNOSIS — E559 Vitamin D deficiency, unspecified: Secondary | ICD-10-CM

## 2018-05-17 MED ORDER — CHOLECALCIFEROL 25 MCG (1000 UT) PO CAPS: 1000.0000 [IU] | ORAL_CAPSULE | Freq: Every day | ORAL | 0 refills | Status: DC

## 2018-06-02 DIAGNOSIS — D229 Melanocytic nevi, unspecified: Secondary | ICD-10-CM | POA: Diagnosis not present

## 2018-06-02 DIAGNOSIS — L821 Other seborrheic keratosis: Secondary | ICD-10-CM | POA: Diagnosis not present

## 2018-06-02 DIAGNOSIS — L570 Actinic keratosis: Secondary | ICD-10-CM | POA: Diagnosis not present

## 2018-08-10 ENCOUNTER — Other Ambulatory Visit: Payer: Self-pay

## 2018-08-10 DIAGNOSIS — E559 Vitamin D deficiency, unspecified: Secondary | ICD-10-CM

## 2018-08-10 NOTE — Telephone Encounter (Signed)
Last office visit 02/05/18 Joycelyn Schmid , NP wanted patient to have Vitamin D level checked in 12 weeks

## 2018-08-23 ENCOUNTER — Encounter: Payer: Self-pay | Admitting: Family

## 2018-09-08 DIAGNOSIS — H93299 Other abnormal auditory perceptions, unspecified ear: Secondary | ICD-10-CM | POA: Diagnosis not present

## 2018-09-08 DIAGNOSIS — H6121 Impacted cerumen, right ear: Secondary | ICD-10-CM | POA: Diagnosis not present

## 2018-09-15 ENCOUNTER — Encounter: Payer: Self-pay | Admitting: Family

## 2018-09-16 ENCOUNTER — Other Ambulatory Visit: Payer: Self-pay

## 2018-09-16 DIAGNOSIS — E663 Overweight: Secondary | ICD-10-CM

## 2018-09-16 MED ORDER — BUPROPION HCL ER (XL) 300 MG PO TB24: ORAL_TABLET | ORAL | 0 refills | Status: DC

## 2018-09-29 DIAGNOSIS — L570 Actinic keratosis: Secondary | ICD-10-CM | POA: Diagnosis not present

## 2018-09-29 DIAGNOSIS — L820 Inflamed seborrheic keratosis: Secondary | ICD-10-CM | POA: Diagnosis not present

## 2018-12-07 ENCOUNTER — Other Ambulatory Visit: Payer: Self-pay | Admitting: Family

## 2018-12-07 DIAGNOSIS — Z1231 Encounter for screening mammogram for malignant neoplasm of breast: Secondary | ICD-10-CM

## 2018-12-08 ENCOUNTER — Ambulatory Visit: Payer: 59 | Admitting: Family

## 2018-12-10 ENCOUNTER — Ambulatory Visit: Payer: 59 | Admitting: Family

## 2018-12-13 ENCOUNTER — Ambulatory Visit: Payer: 59 | Admitting: Family

## 2018-12-13 ENCOUNTER — Other Ambulatory Visit: Payer: Self-pay | Admitting: Family

## 2018-12-13 ENCOUNTER — Encounter: Payer: Self-pay | Admitting: Family

## 2018-12-13 DIAGNOSIS — E663 Overweight: Secondary | ICD-10-CM

## 2018-12-15 ENCOUNTER — Other Ambulatory Visit: Payer: Self-pay | Admitting: Family

## 2018-12-15 DIAGNOSIS — E663 Overweight: Secondary | ICD-10-CM

## 2018-12-15 MED ORDER — BUPROPION HCL ER (XL) 300 MG PO TB24: 300.0000 mg | ORAL_TABLET | Freq: Every morning | ORAL | 0 refills | Status: DC

## 2018-12-22 ENCOUNTER — Ambulatory Visit
Admission: RE | Admit: 2018-12-22 | Discharge: 2018-12-22 | Disposition: A | Discharge status: Home / Self Care | Payer: 59 | Source: Ambulatory Visit | Attending: Family | Admitting: Family

## 2018-12-22 DIAGNOSIS — Z1231 Encounter for screening mammogram for malignant neoplasm of breast: Secondary | ICD-10-CM

## 2018-12-23 ENCOUNTER — Other Ambulatory Visit: Payer: Self-pay | Admitting: Family

## 2018-12-23 DIAGNOSIS — N6489 Other specified disorders of breast: Secondary | ICD-10-CM

## 2018-12-23 DIAGNOSIS — R928 Other abnormal and inconclusive findings on diagnostic imaging of breast: Secondary | ICD-10-CM

## 2018-12-24 ENCOUNTER — Encounter: Payer: Self-pay | Admitting: Family

## 2018-12-28 ENCOUNTER — Ambulatory Visit
Admission: RE | Admit: 2018-12-28 | Discharge: 2018-12-28 | Disposition: A | Discharge status: Home / Self Care | Payer: 59 | Source: Ambulatory Visit | Attending: Family | Admitting: Family

## 2018-12-28 DIAGNOSIS — N6489 Other specified disorders of breast: Secondary | ICD-10-CM | POA: Insufficient documentation

## 2018-12-28 DIAGNOSIS — R928 Other abnormal and inconclusive findings on diagnostic imaging of breast: Secondary | ICD-10-CM | POA: Insufficient documentation

## 2018-12-30 ENCOUNTER — Other Ambulatory Visit: Payer: 59

## 2018-12-30 ENCOUNTER — Encounter: Payer: Self-pay | Admitting: Family

## 2019-02-07 ENCOUNTER — Encounter: Payer: Self-pay | Admitting: Family

## 2019-02-11 ENCOUNTER — Telehealth: Payer: Self-pay

## 2019-02-11 NOTE — Telephone Encounter (Signed)
Copied from [place] [phone]. Topic: Quick Communication - See Telephone Encounter >> Feb 11, 2019 11:43 AM Blase Mess A wrote: CRM for notification. See Telephone encounter for: 02/11/19.  Patient called back for Mikayla Wagner.  Patient does not want to keep the appt as a follow up. Thank you

## 2019-02-11 NOTE — Telephone Encounter (Signed)
I have canceled.

## 2019-02-14 ENCOUNTER — Encounter: Payer: Self-pay | Admitting: Family

## 2019-02-16 ENCOUNTER — Encounter: Payer: 59 | Admitting: Family

## 2019-02-21 ENCOUNTER — Encounter: Payer: 59 | Admitting: Family

## 2019-02-23 ENCOUNTER — Encounter: Payer: Self-pay | Admitting: Family

## 2019-04-18 ENCOUNTER — Encounter: Payer: Medicaid Other | Admitting: Family

## 2019-05-09 ENCOUNTER — Encounter: Payer: Medicaid Other | Admitting: Family

## 2019-05-11 ENCOUNTER — Other Ambulatory Visit: Payer: Self-pay

## 2019-05-23 ENCOUNTER — Encounter: Payer: Self-pay | Admitting: Family

## 2019-05-23 ENCOUNTER — Ambulatory Visit (INDEPENDENT_AMBULATORY_CARE_PROVIDER_SITE_OTHER): Payer: Managed Care, Other (non HMO) | Admitting: Family

## 2019-05-23 ENCOUNTER — Other Ambulatory Visit: Payer: Self-pay

## 2019-05-23 VITALS — BP 128/88 | HR 102 | Temp 98.5°F | Ht 61.5 in | Wt 146.4 lb

## 2019-05-23 DIAGNOSIS — F4321 Adjustment disorder with depressed mood: Secondary | ICD-10-CM

## 2019-05-23 DIAGNOSIS — Z0000 Encounter for general adult medical examination without abnormal findings: Secondary | ICD-10-CM | POA: Diagnosis not present

## 2019-05-23 DIAGNOSIS — Z8542 Personal history of malignant neoplasm of other parts of uterus: Secondary | ICD-10-CM | POA: Diagnosis not present

## 2019-05-23 NOTE — Patient Instructions (Addendum)
Tetanus due  Labs at [facility]  Stay safe!   Health Maintenance for Postmenopausal Women Menopause is a normal process in which your ability to get pregnant comes to an end. This process happens slowly over many months or years, usually between the ages of 59 and 70. Menopause is complete when you have missed your menstrual periods for 12 months. It is important to talk with your health care provider about some of the most common conditions that affect women after menopause (postmenopausal women). These include heart disease, cancer, and bone loss (osteoporosis). Adopting a healthy lifestyle and getting preventive care can help to promote your health and wellness. The actions you take can also lower your chances of developing some of these common conditions. What should I know about menopause? During menopause, you may get a number of symptoms, such as:  Hot flashes. These can be moderate or severe.  Night sweats.  Decrease in sex drive.  Mood swings.  Headaches.  Tiredness.  Irritability.  Memory problems.  Insomnia. Choosing to treat or not to treat these symptoms is a decision that you make with your health care provider. Do I need hormone replacement therapy?  Hormone replacement therapy is effective in treating symptoms that are caused by menopause, such as hot flashes and night sweats.  Hormone replacement carries certain risks, especially as you become older. If you are thinking about using estrogen or estrogen with progestin, discuss the benefits and risks with your health care provider. What is my risk for heart disease and stroke? The risk of heart disease, heart attack, and stroke increases as you age. One of the causes may be a change in the body's hormones during menopause. This can affect how your body uses dietary fats, triglycerides, and cholesterol. Heart attack and stroke are medical emergencies. There are many things that you can do to help prevent heart disease  and stroke. Watch your blood pressure  High blood pressure causes heart disease and increases the risk of stroke. This is more likely to develop in people who have high blood pressure readings, are of African descent, or are overweight.  Have your blood pressure checked: ? Every 3-5 years if you are 80-23 years of age. ? Every year if you are 37 years old or older. Eat a healthy diet   Eat a diet that includes plenty of vegetables, fruits, low-fat dairy products, and lean protein.  Do not eat a lot of foods that are high in solid fats, added sugars, or sodium. Get regular exercise Get regular exercise. This is one of the most important things you can do for your health. Most adults should:  Try to exercise for at least 150 minutes each week. The exercise should increase your heart rate and make you sweat (moderate-intensity exercise).  Try to do strengthening exercises at least twice each week. Do these in addition to the moderate-intensity exercise.  Spend less time sitting. Even light physical activity can be beneficial. Other tips  Work with your health care provider to achieve or maintain a healthy weight.  Do not use any products that contain nicotine or tobacco, such as cigarettes, e-cigarettes, and chewing tobacco. If you need help quitting, ask your health care provider.  Know your numbers. Ask your health care provider to check your cholesterol and your blood sugar (glucose). Continue to have your blood tested as directed by your health care provider. Do I need screening for cancer? Depending on your health history and family history, you may need  to have cancer screening at different stages of your life. This may include screening for:  Breast cancer.  Cervical cancer.  Lung cancer.  Colorectal cancer. What is my risk for osteoporosis? After menopause, you may be at increased risk for osteoporosis. Osteoporosis is a condition in which bone destruction happens more  quickly than new bone creation. To help prevent osteoporosis or the bone fractures that can happen because of osteoporosis, you may take the following actions:  If you are 78-39 years old, get at least 1,000 mg of calcium and at least 600 mg of vitamin D per day.  If you are older than age 71 but younger than age 58, get at least 1,200 mg of calcium and at least 600 mg of vitamin D per day.  If you are older than age 85, get at least 1,200 mg of calcium and at least 800 mg of vitamin D per day. Smoking and drinking excessive alcohol increase the risk of osteoporosis. Eat foods that are rich in calcium and vitamin D, and do weight-bearing exercises several times each week as directed by your health care provider. How does menopause affect my mental health? Depression may occur at any age, but it is more common as you become older. Common symptoms of depression include:  Low or sad mood.  Changes in sleep patterns.  Changes in appetite or eating patterns.  Feeling an overall lack of motivation or enjoyment of activities that you previously enjoyed.  Frequent crying spells. Talk with your health care provider if you think that you are experiencing depression. General instructions See your health care provider for regular wellness exams and vaccines. This may include:  Scheduling regular health, dental, and eye exams.  Getting and maintaining your vaccines. These include: ? Influenza vaccine. Get this vaccine each year before the flu season begins. ? Pneumonia vaccine. ? Shingles vaccine. ? Tetanus, diphtheria, and pertussis (Tdap) booster vaccine. Your health care provider may also recommend other immunizations. Tell your health care provider if you have ever been abused or do not feel safe at home. Summary  Menopause is a normal process in which your ability to get pregnant comes to an end.  This condition causes hot flashes, night sweats, decreased interest in sex, mood swings,  headaches, or lack of sleep.  Treatment for this condition may include hormone replacement therapy.  Take actions to keep yourself healthy, including exercising regularly, eating a healthy diet, watching your weight, and checking your blood pressure and blood sugar levels.  Get screened for cancer and depression. Make sure that you are up to date with all your vaccines. This information is not intended to replace advice given to you by your health care provider. Make sure you discuss any questions you have with your health care provider. Document Released: 12/19/2005 Document Revised: 10/20/2018 Document Reviewed: 10/20/2018 Elsevier Patient Education  2020 [employer].

## 2019-05-23 NOTE — Assessment & Plan Note (Signed)
Stable on wellbutrin. Will continue

## 2019-05-23 NOTE — Assessment & Plan Note (Addendum)
CBE performed. Patient will call to ask for 3d mammogram in the Fall. Declines pap smear. Deferred pelvic exam in the absence of complaints.

## 2019-05-23 NOTE — Progress Notes (Signed)
Pre visit review using our clinic review tool, if applicable. No additional management support is needed unless otherwise documented below in the visit note. 

## 2019-05-23 NOTE — Progress Notes (Signed)
Subjective:    Patient ID: Mikayla Wagner, female    DOB: [birth year], 58 y.o.   MRN: [id number]  CC: Mikayla Wagner is a 58 y.o. female who presents today for physical exam.    HPI: Over all doing well. No complaints.   Depression-doing well wellbutrin .      Colorectal Cancer Screening: UTD repeat in 10 years Breast Cancer Screening: Mammogram UTD, particularly of left breast.  Cervical Cancer Screening: h/o hysterectomy for ovarian cyst. No vaginal bleeding, pelvic pain. Declines Pap smear today . States was seen by oncology, Dr Sabra Heck,  ( referred by Dr Gilford Rile) for pap which was normal. She was told she didn't not need a pap smear in future Bone Health screening/DEXA for 65+: No increased fracture risk. Defer screening at this time. Lung Cancer Screening: Doesn't have 30 year pack year history and age > 52 years. Immunizations       Tetanus - due; will get at work      HIV Screening- Candidate for.  Labs: Screening labs today. Exercise: Gets regular exercise.  Alcohol use: socially; on occasion Smoking/tobacco use: Nonsmoker.  Regular dental exams:  HISTORY:  Past Medical History:  Diagnosis Date  . Abnormal mammogram    2013, plan to repeat mammogram 05/2013  . Ovarian cyst, complex 2010   s/p removal    Past Surgical History:  Procedure Laterality Date  . ABDOMINAL HYSTERECTOMY     no cervix  . APPENDECTOMY    . AUGMENTATION MAMMAPLASTY Bilateral 2006   saline  . BREAST ENHANCEMENT SURGERY  2006  . CESAREAN SECTION     4x  . COLON SURGERY    . COLONOSCOPY WITH PROPOFOL N/A 12/10/2016   Procedure: COLONOSCOPY WITH PROPOFOL;  Surgeon: Robert Bellow, MD;  Location: [facility]COPY;  Service: Endoscopy;  Laterality: N/A;  . DILATION AND CURETTAGE OF UTERUS  2005  . PARTIAL COLECTOMY  2005   ruptured diverticuli, s/p colostomy and reversal  . SCAR REVISION  2011   Family History  Problem Relation Age of Onset  . Cancer Paternal Aunt        ovarian  cancer  . Breast cancer Paternal Aunt       ALLERGIES: Codeine, Erythromycin, Morphine sulfate, and Latex  Current Outpatient Medications on File Prior to Visit  Medication Sig Dispense Refill  . buPROPion (WELLBUTRIN XL) 300 MG 24 hr tablet Take 1 tablet (300 mg total) by mouth every morning. 90 tablet 0  . Cholecalciferol 1000 units capsule Take 1 capsule (1,000 Units total) by mouth daily. 90 capsule 0  . hyoscyamine (LEVSIN, ANASPAZ) 0.125 MG tablet TAKE 1 TABLET (0.125 MG TOTAL) BY MOUTH ONCE DAILY AS NEEDED FOR CRAMPING. 90 tablet 1  . ibuprofen (ADVIL,MOTRIN) 800 MG tablet Take 800 mg by mouth every 8 (eight) hours as needed.      . nitrofurantoin, macrocrystal-monohydrate, (MACROBID) 100 MG capsule Take 1 capsule (100 mg total) by mouth at bedtime. 30 capsule 0   Current Facility-Administered Medications on File Prior to Visit  Medication Dose Route Frequency Provider Last Rate Last Dose  . cyanocobalamin ((VITAMIN B-12)) injection 1,000 mcg  1,000 mcg Intramuscular Once Jackolyn Confer, MD        Social History   Tobacco Use  . Smoking status: Never Smoker  . Smokeless tobacco: Never Used  Substance Use Topics  . Alcohol use: Yes    Comment: socially  . Drug use: No    Review of Systems  Constitutional: Negative for chills, fever and unexpected weight change.  HENT: Negative for congestion.   Respiratory: Negative for cough.   Cardiovascular: Negative for chest pain, palpitations and leg swelling.  Gastrointestinal: Negative for nausea and vomiting.  Genitourinary: Negative for pelvic pain and vaginal bleeding.  Musculoskeletal: Negative for arthralgias and myalgias.  Skin: Negative for rash.  Neurological: Negative for headaches.  Hematological: Negative for adenopathy.  Psychiatric/Behavioral: Negative for confusion.      Objective:    BP 128/88   Pulse (!) 102   Temp 98.5 F (36.9 C) (Oral)   Ht 5' 1.5" (1.562 m)   Wt 146 lb 6.4 oz (66.4 kg)   SpO2  93%   BMI 27.21 kg/m   BP Readings from Last 3 Encounters:  05/23/19 128/88  02/05/18 124/74  12/30/17 130/82   Wt Readings from Last 3 Encounters:  05/23/19 146 lb 6.4 oz (66.4 kg)  02/05/18 151 lb 2 oz (68.5 kg)  12/30/17 157 lb 2 oz (71.3 kg)    Physical Exam Vitals signs reviewed.  Constitutional:      Appearance: She is well-developed.  Eyes:     Conjunctiva/sclera: Conjunctivae normal.  Neck:     Thyroid: No thyroid mass or thyromegaly.  Cardiovascular:     Rate and Rhythm: Normal rate and regular rhythm.     Pulses: Normal pulses.     Heart sounds: Normal heart sounds.  Pulmonary:     Effort: Pulmonary effort is normal.     Breath sounds: Normal breath sounds. No wheezing, rhonchi or rales.  Chest:     Breasts: Breasts are symmetrical.        Right: No inverted nipple, mass, nipple discharge, skin change or tenderness.        Left: No inverted nipple, mass, nipple discharge, skin change or tenderness.  Lymphadenopathy:     Head:     Right side of head: No submental, submandibular, tonsillar, preauricular, posterior auricular or occipital adenopathy.     Left side of head: No submental, submandibular, tonsillar, preauricular, posterior auricular or occipital adenopathy.     Cervical: No cervical adenopathy.     Right cervical: No superficial, deep or posterior cervical adenopathy.    Left cervical: No superficial, deep or posterior cervical adenopathy.  Skin:    General: Skin is warm and dry.  Neurological:     Mental Status: She is alert.  Psychiatric:        Speech: Speech normal.        Behavior: Behavior normal.        Thought Content: Thought content normal.        Assessment & Plan:   Problem List Items Addressed This Visit      Other   Adjustment disorder with depressed mood    Stable on wellbutrin. Will continue      Routine general medical examination at a health care facility - Primary    CBE performed. Patient will call to ask for 3d  mammogram in the Fall. Declines pap smear. Deferred pelvic exam in the absence of complaints.       Relevant Orders   TSH   CBC with Differential/Platelet   Comprehensive metabolic panel   Hemoglobin A1c   Lipid panel   VITAMIN D 25 Hydroxy (Vit-D Deficiency, Fractures)   B12 and Folate Panel   HIV Antibody (routine testing w rflx)   History of uterine cancer    Again, patient declines pap smear after seeing oncology.  I am having Audrie Gallus. Pelley maintain her ibuprofen, hyoscyamine, nitrofurantoin (macrocrystal-monohydrate), Cholecalciferol, and buPROPion. We will continue to administer cyanocobalamin.   No orders of the defined types were placed in this encounter.   Return precautions given.   Risks, benefits, and alternatives of the medications and treatment plan prescribed today were discussed, and patient expressed understanding.   Education regarding symptom management and diagnosis given to patient on AVS.   Continue to follow with Burnard Hawthorne, FNP for routine health maintenance.   Alden Server and I agreed with plan.   Mable Paris, FNP

## 2019-05-23 NOTE — Assessment & Plan Note (Signed)
Again, patient declines pap smear after seeing oncology.

## 2019-05-27 ENCOUNTER — Other Ambulatory Visit: Payer: Self-pay | Admitting: Family

## 2019-05-27 DIAGNOSIS — E663 Overweight: Secondary | ICD-10-CM

## 2019-05-27 MED ORDER — BUPROPION HCL ER (XL) 300 MG PO TB24: 300.0000 mg | ORAL_TABLET | Freq: Every morning | ORAL | 1 refills | Status: DC

## 2019-05-31 ENCOUNTER — Other Ambulatory Visit: Payer: Self-pay | Admitting: Family

## 2019-05-31 DIAGNOSIS — E663 Overweight: Secondary | ICD-10-CM

## 2019-06-03 ENCOUNTER — Encounter: Payer: Self-pay | Admitting: Family

## 2019-07-06 LAB — BASIC METABOLIC PANEL
Creatinine: 0.7 (ref <=1.1)
Glucose: 96

## 2019-07-07 LAB — B12 AND FOLATE PANEL
Folate: 13.4 ng/mL (ref >3.0)
Vitamin B-12: 590 pg/mL (ref 232–1245)

## 2019-07-07 LAB — COMPREHENSIVE METABOLIC PANEL
ALT: 11 IU/L (ref 0–32)
AST: 14 IU/L (ref 0–40)
Albumin/Globulin Ratio: 2.2 (ref 1.2–2.2)
Albumin: 4.3 g/dL (ref 3.8–4.9)
Alkaline Phosphatase: 56 IU/L (ref 39–117)
BUN/Creatinine Ratio: 13 (ref 9–23)
BUN: 9 mg/dL (ref 6–24)
Bilirubin Total: 0.4 mg/dL (ref 0.0–1.2)
CO2: 24 mmol/L (ref 20–29)
Calcium: 9 mg/dL (ref 8.7–10.2)
Chloride: 102 mmol/L (ref 96–106)
Creatinine, Ser: 0.71 mg/dL (ref 0.57–1.00)
GFR calc Af Amer: 109 mL/min/{1.73_m2} (ref >59)
GFR calc non Af Amer: 94 mL/min/{1.73_m2} (ref >59)
Globulin, Total: 2 g/dL (ref 1.5–4.5)
Glucose: 92 mg/dL (ref 65–99)
Potassium: 3.8 mmol/L (ref 3.5–5.2)
Sodium: 141 mmol/L (ref 134–144)
Total Protein: 6.3 g/dL (ref 6.0–8.5)

## 2019-07-07 LAB — CBC WITH DIFFERENTIAL/PLATELET
Basophils Absolute: 0.1 x10E3/uL (ref 0.0–0.2)
Basos: 1 %
EOS (ABSOLUTE): 0.1 x10E3/uL (ref 0.0–0.4)
Eos: 3 %
Hematocrit: 41.5 % (ref 34.0–46.6)
Hemoglobin: 13.9 g/dL (ref 11.1–15.9)
Immature Grans (Abs): 0 x10E3/uL (ref 0.0–0.1)
Immature Granulocytes: 0 %
Lymphocytes Absolute: 1.7 x10E3/uL (ref 0.7–3.1)
Lymphs: 32 %
MCH: 28.9 pg (ref 26.6–33.0)
MCHC: 33.5 g/dL (ref 31.5–35.7)
MCV: 86 fL (ref 79–97)
Monocytes Absolute: 0.3 x10E3/uL (ref 0.1–0.9)
Monocytes: 6 %
Neutrophils Absolute: 3.1 x10E3/uL (ref 1.4–7.0)
Neutrophils: 58 %
Platelets: 295 x10E3/uL (ref 150–450)
RBC: 4.81 x10E6/uL (ref 3.77–5.28)
RDW: 12.9 % (ref 11.7–15.4)
WBC: 5.3 x10E3/uL (ref 3.4–10.8)

## 2019-07-07 LAB — VITAMIN D 25 HYDROXY (VIT D DEFICIENCY, FRACTURES): Vit D, 25-Hydroxy: 26.5 ng/mL — ABNORMAL LOW (ref 30.0–100.0)

## 2019-07-07 LAB — TSH: TSH: 2.72 u[IU]/mL (ref 0.450–4.500)

## 2019-07-07 LAB — LIPID PANEL
Chol/HDL Ratio: 2.5 ratio (ref 0.0–4.4)
Cholesterol, Total: 193 mg/dL (ref 100–199)
HDL: 77 mg/dL
LDL Calculated: 105 mg/dL — ABNORMAL HIGH (ref 0–99)
Triglycerides: 56 mg/dL (ref 0–149)
VLDL Cholesterol Cal: 11 mg/dL (ref 5–40)

## 2019-07-07 LAB — HIV ANTIBODY (ROUTINE TESTING W REFLEX): HIV Screen 4th Generation wRfx: NONREACTIVE

## 2019-07-07 LAB — HEMOGLOBIN A1C
Est. average glucose Bld gHb Est-mCnc: 108 mg/dL
Hgb A1c MFr Bld: 5.4 % (ref 4.8–5.6)

## 2019-10-05 ENCOUNTER — Other Ambulatory Visit: Payer: Self-pay | Admitting: Family

## 2019-10-05 DIAGNOSIS — E663 Overweight: Secondary | ICD-10-CM

## 2019-12-12 ENCOUNTER — Other Ambulatory Visit: Payer: Self-pay | Admitting: Family

## 2019-12-12 DIAGNOSIS — Z1231 Encounter for screening mammogram for malignant neoplasm of breast: Secondary | ICD-10-CM

## 2020-01-12 ENCOUNTER — Ambulatory Visit
Admission: RE | Admit: 2020-01-12 | Discharge: 2020-01-12 | Disposition: A | Discharge status: Home / Self Care | Payer: Managed Care, Other (non HMO) | Source: Ambulatory Visit | Attending: Family | Admitting: Family

## 2020-01-12 DIAGNOSIS — Z1231 Encounter for screening mammogram for malignant neoplasm of breast: Secondary | ICD-10-CM | POA: Insufficient documentation

## 2020-02-01 IMAGING — MG DIGITAL DIAGNOSTIC UNILATERAL LEFT MAMMOGRAM WITH IMPLANTS, CAD
4 series · 4 of 12 positions shown · non-contrast
Comparison: December 22, 2018 and earlier priors

CLINICAL DATA: 57-year-old patient recalled from recent 2D
screening mammogram for evaluation of a possible asymmetry in the
medial left breast.

EXAM:
DIGITAL DIAGNOSTIC LEFT MAMMOGRAM WITH IMPLANTS AND TOMO
ULTRASOUND LEFT BREAST
The patient has retropectoral implants. Standard and implant
displaced views were performed.

[L MLO synth-2D]
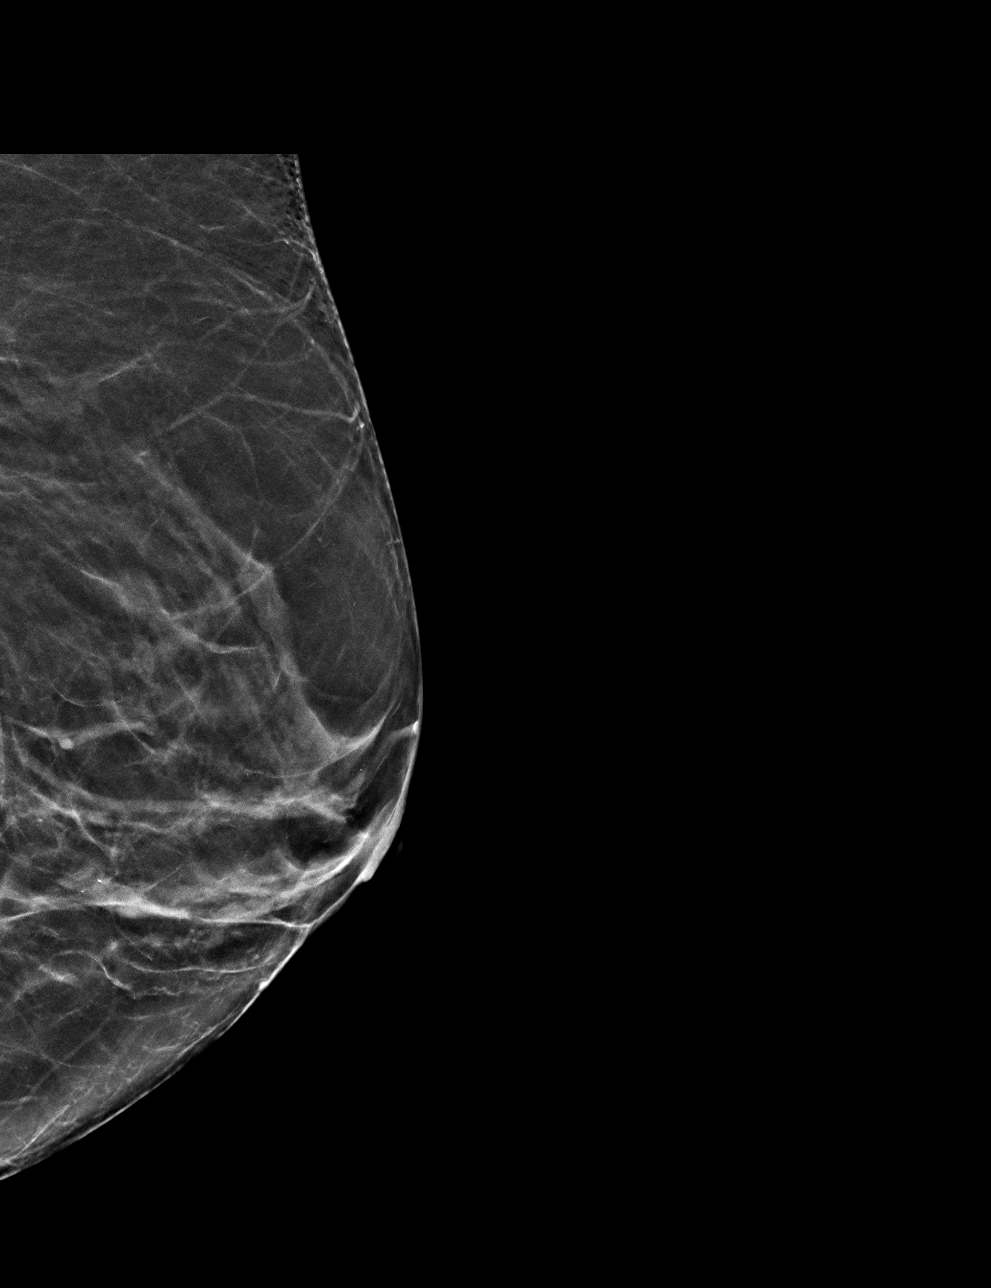

[L CC synth-2D]
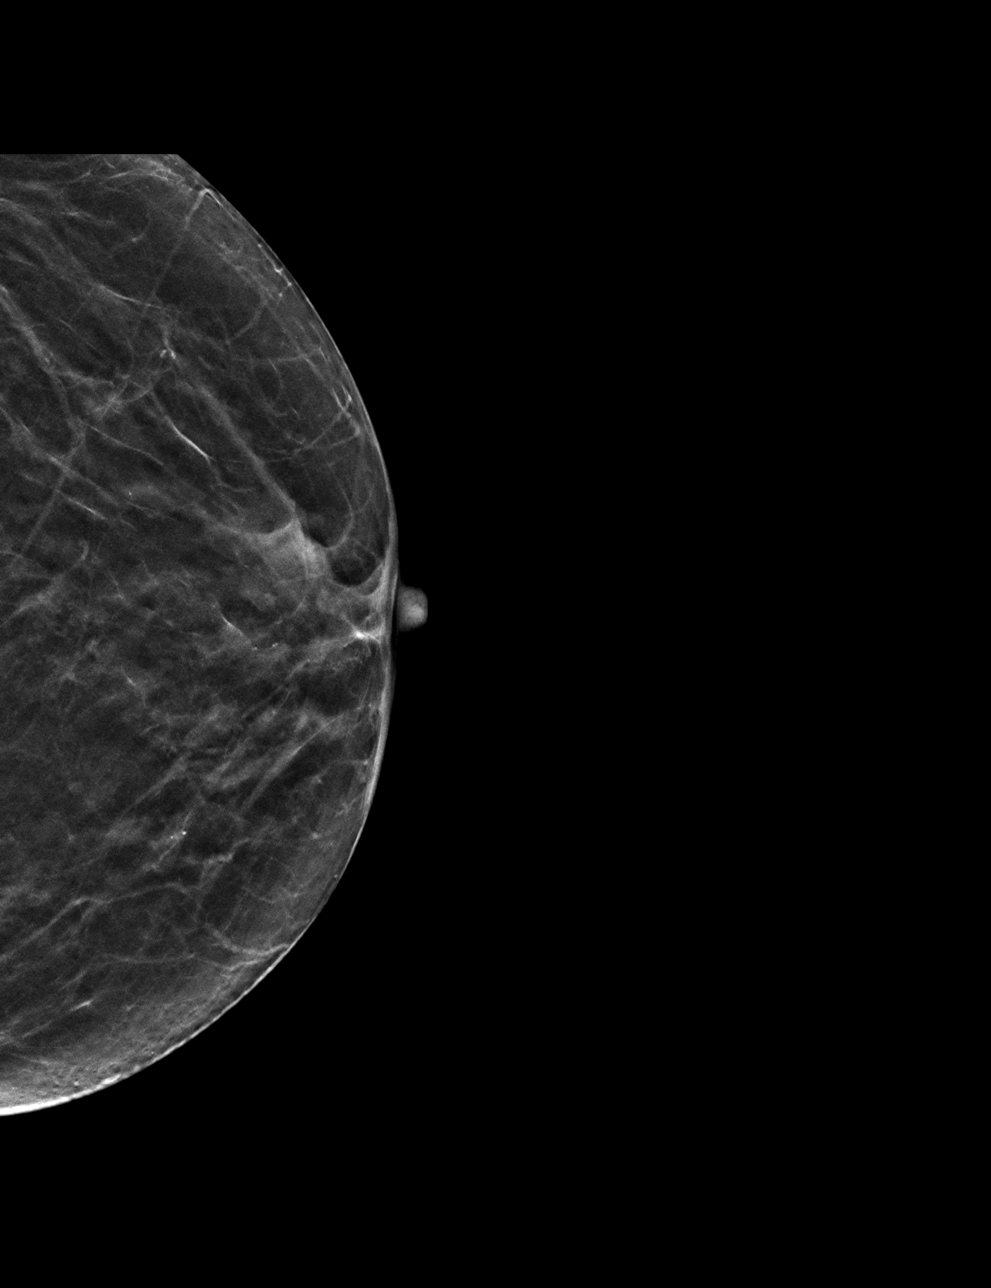

[L MLOID BREAST TOMOSYNTHESIS IMAGE tomo · tomo slice 25/49.0]
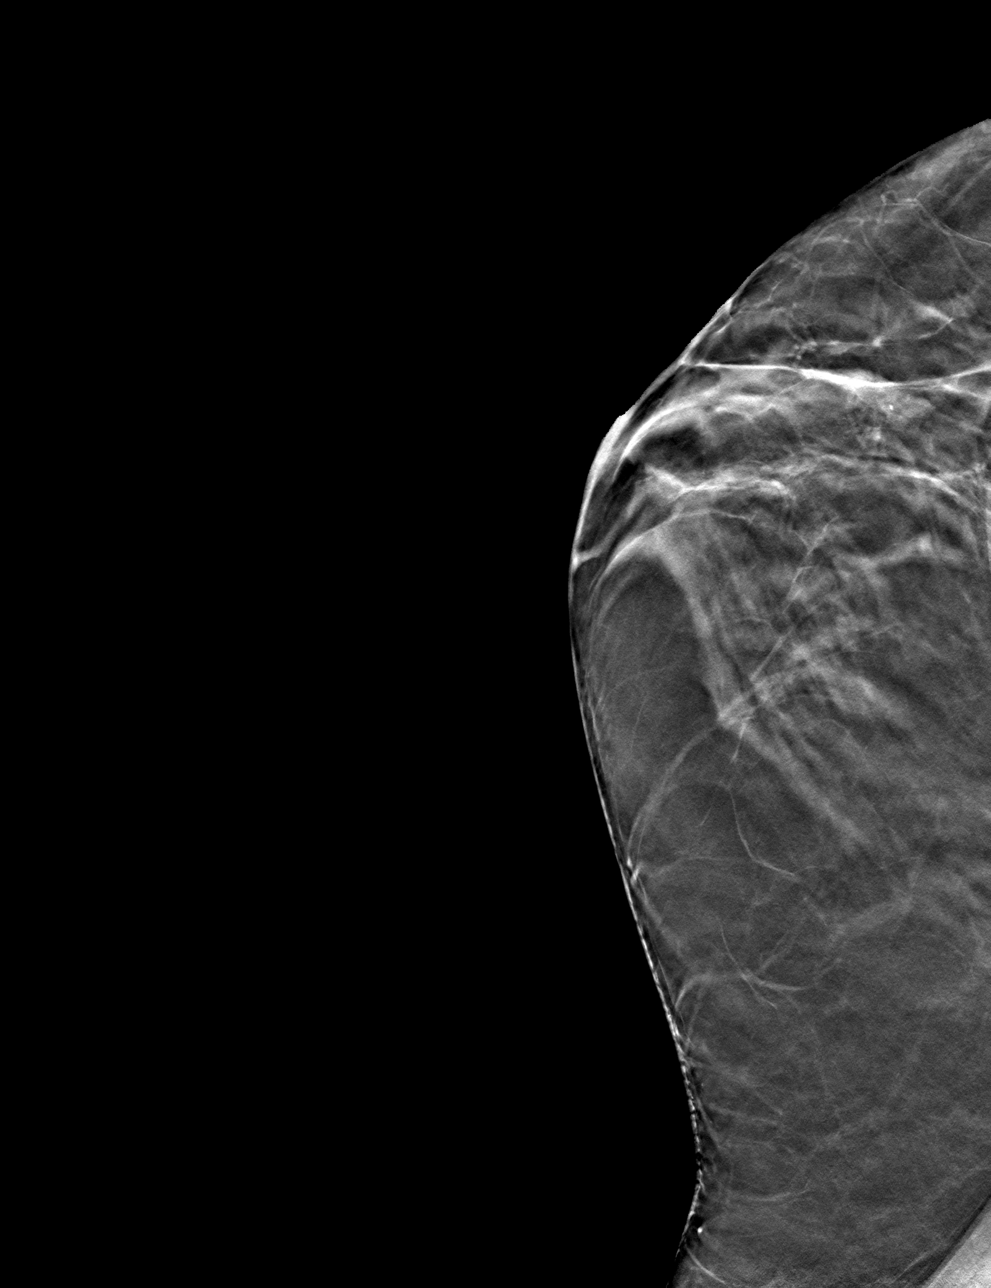

[L CCID BREAST TOMOSYNTHESIS IMAGE tomo · tomo slice 21/40.0]
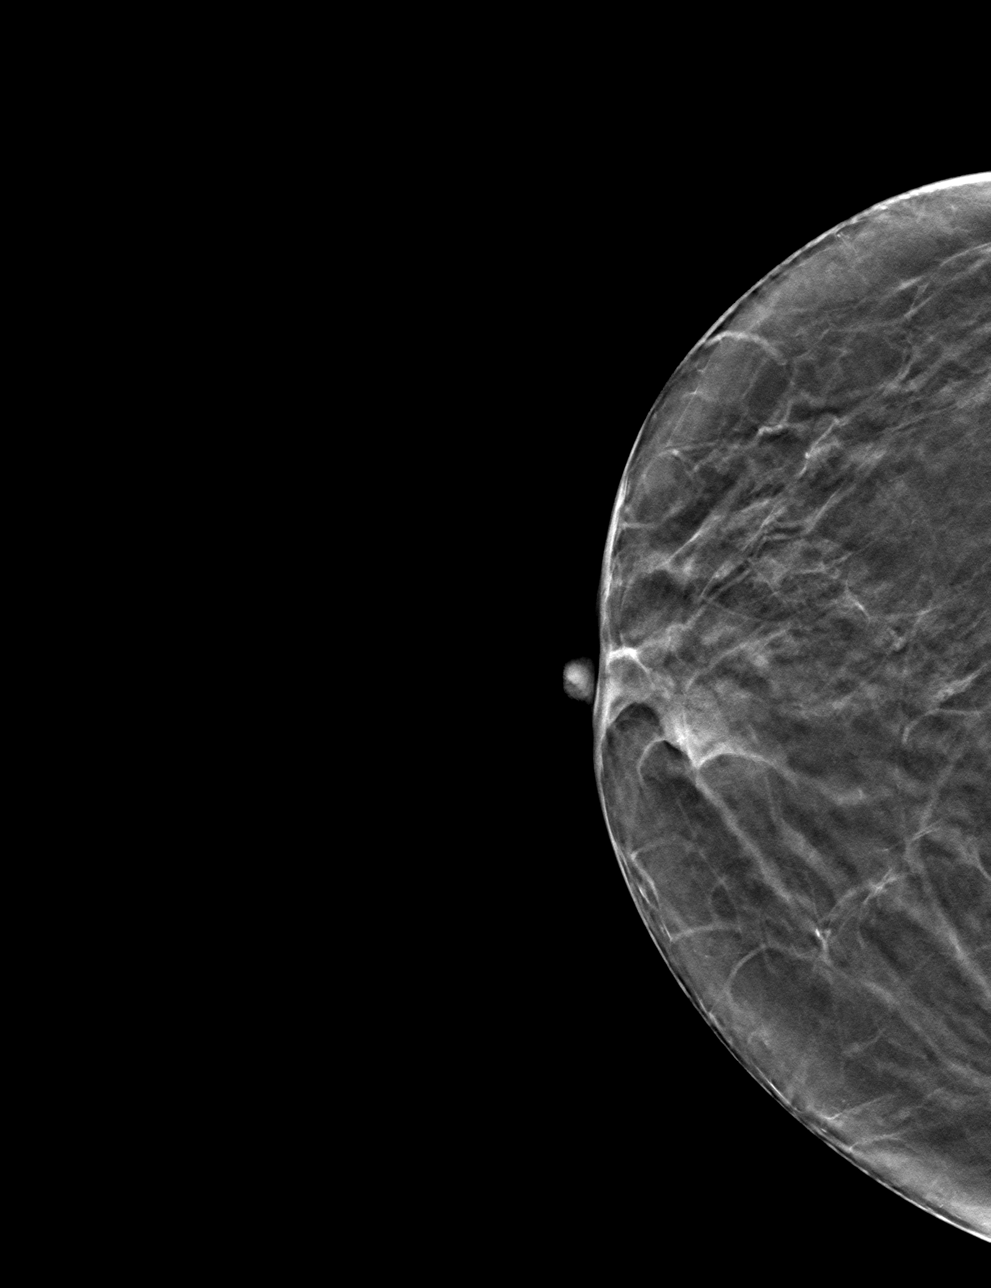

[4 of 12 positions shown; findings below may reference images not displayed]

ACR Breast Density Category b: There are scattered areas of
fibroglandular density.
FINDINGS: Whole breast CC and MLO views with tomography, with the implant
displaced, are performed. There is an approximately 9 mm mass with 2
associated benign appearing calcifications in the 9 o'clock region
of the left breast.

On physical exam, no mass is palpated in the medial right breast in
the 9 o'clock region..

Targeted ultrasound is performed, showing a cluster of cysts
containing two echogenic foci consistent with the calcifications
seen at mammography. This is identified at 9 o'clock position 4 cm
from the nipple and measures 0.8 x 0.3 x 0.4 cm. There is no
internal vascular flow. Additional ultrasound of the medial left
breast is negative.
IMPRESSION: Benign cluster of cysts with two internal layering calcifications
account for the questioned asymmetry on recent 2D screening
mammogram. No evidence of malignancy.

RECOMMENDATION:
Screening mammogram in one year.(Code:YM-X-Y2X)

I have discussed the findings and recommendations with the patient.
Results were also provided in writing at the conclusion of the
visit. If applicable, a reminder letter will be sent to the patient
regarding the next appointment.

BI-RADS CATEGORY  2: Benign.

## 2020-02-01 IMAGING — US ULTRASOUND LEFT BREAST LIMITED
1 series · 7 of 7 positions shown · non-contrast
Comparison: December 22, 2018 and earlier priors

CLINICAL DATA: 57-year-old patient recalled from recent 2D
screening mammogram for evaluation of a possible asymmetry in the
medial left breast.

EXAM:
DIGITAL DIAGNOSTIC LEFT MAMMOGRAM WITH IMPLANTS AND TOMO
ULTRASOUND LEFT BREAST
The patient has retropectoral implants. Standard and implant
displaced views were performed.

[Series 1: ultrasound left breast limited · 0.05mm/px · 7 of 7 slices shown]
[im 1/7]
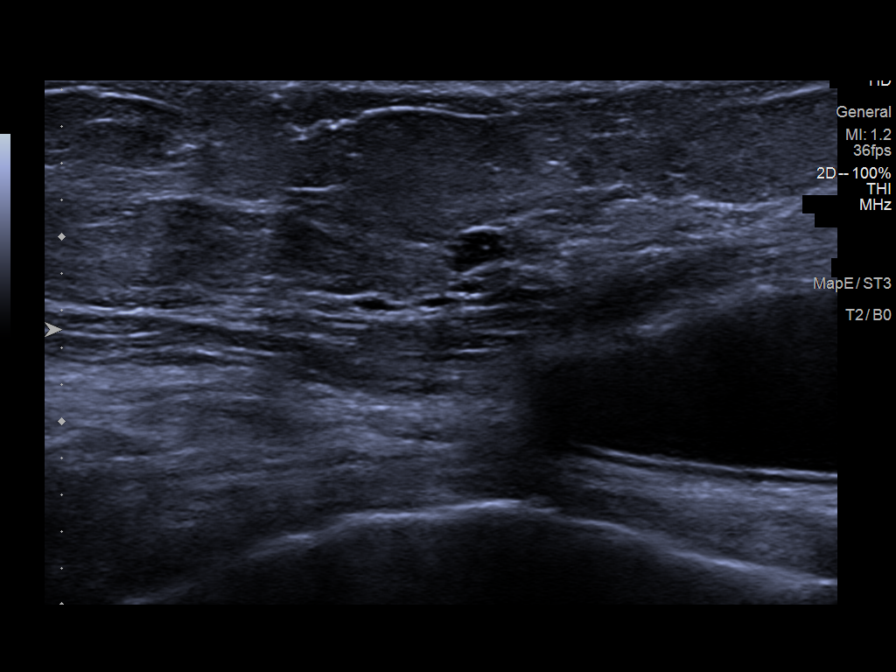
[im 2/7]
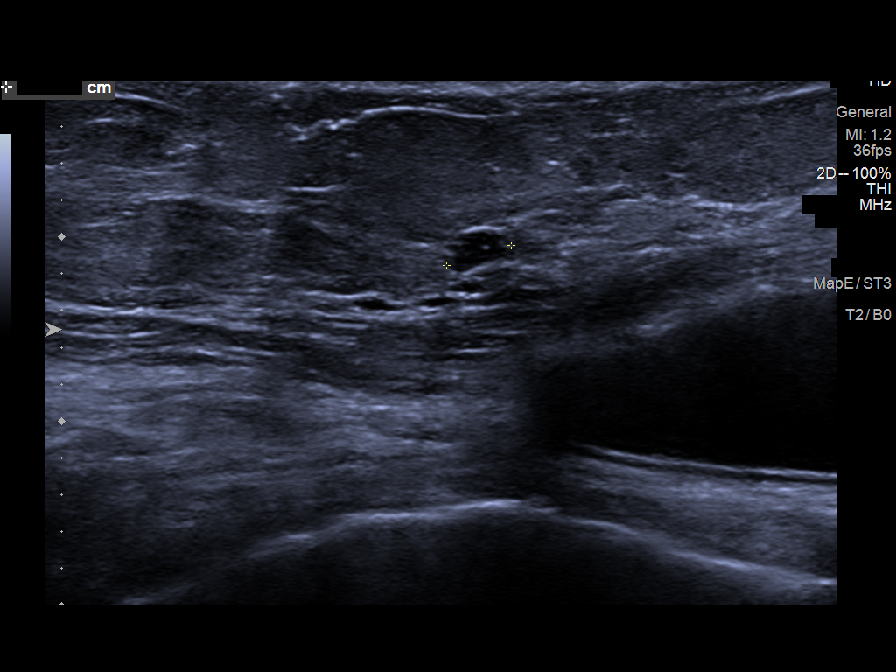
[im 3/7]
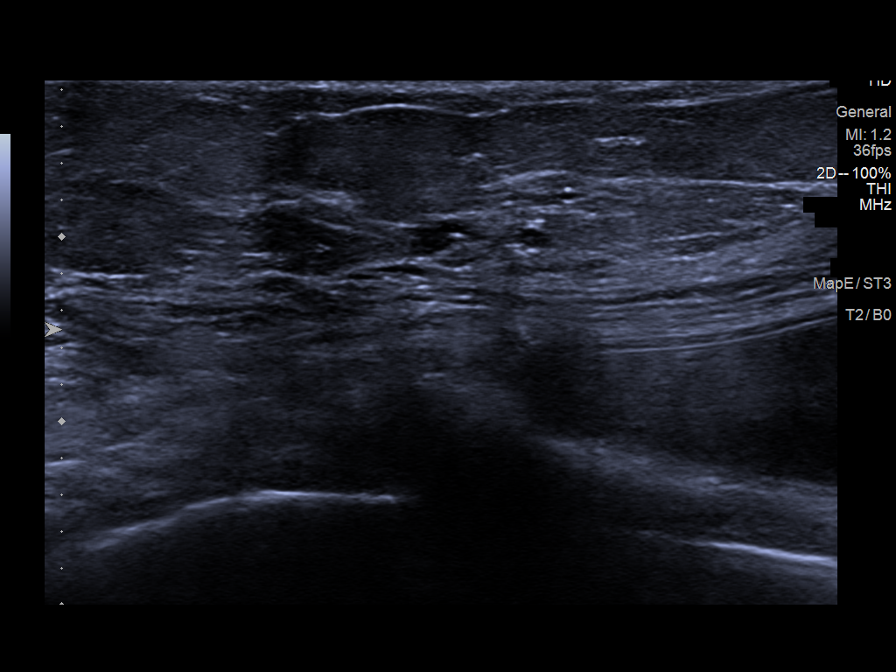
[im 4/7]
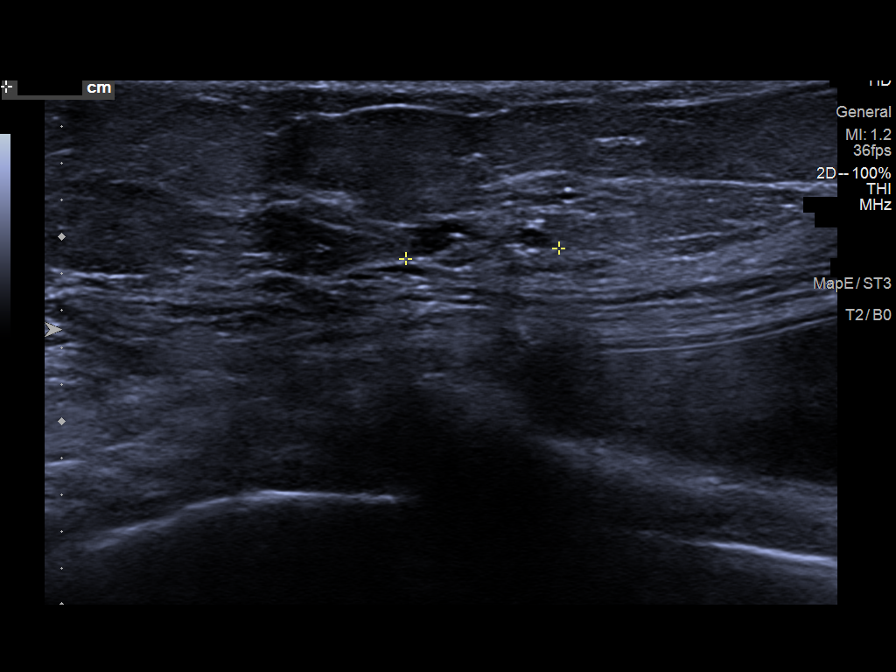
[im 5/7]
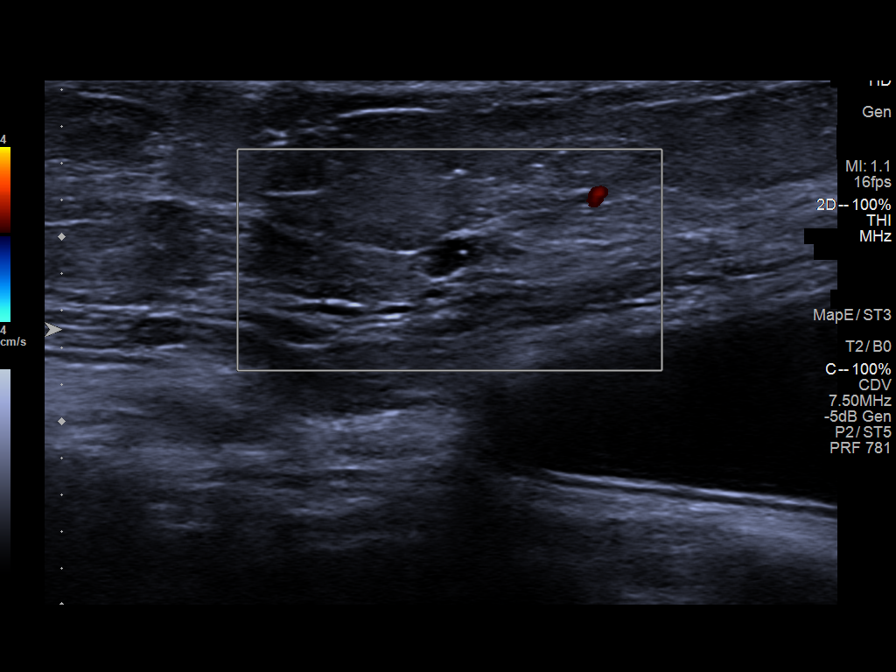
[im 6/7]
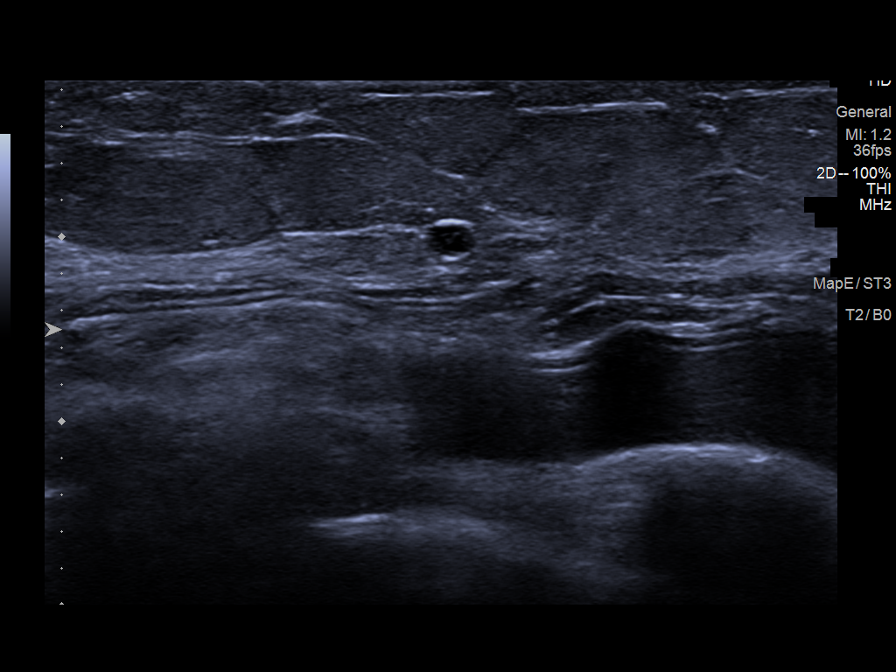
[im 7/7]
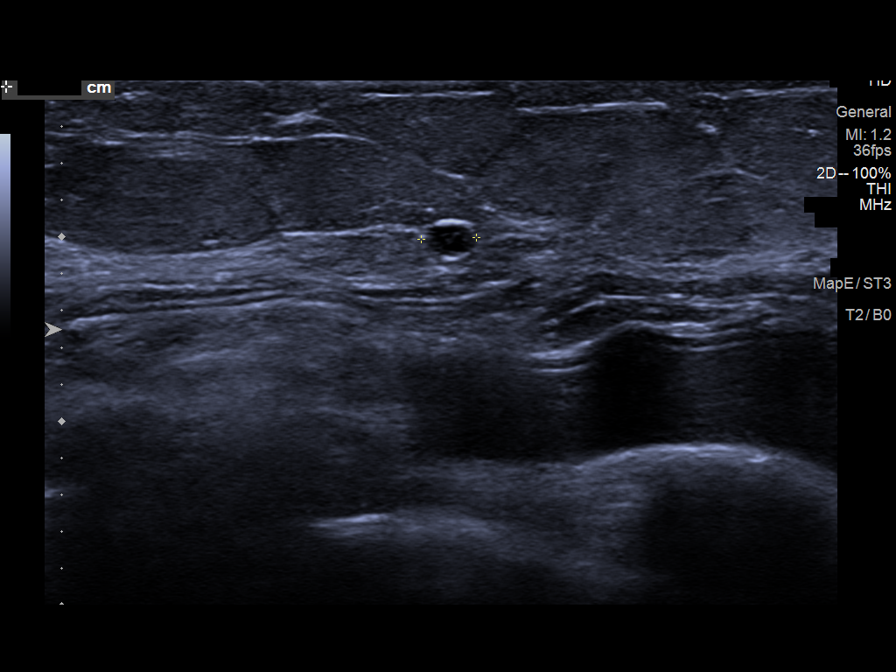

[7 of 7 positions shown; findings below may reference images not displayed]

ACR Breast Density Category b: There are scattered areas of
fibroglandular density.
FINDINGS: Whole breast CC and MLO views with tomography, with the implant
displaced, are performed. There is an approximately 9 mm mass with 2
associated benign appearing calcifications in the 9 o'clock region
of the left breast.

On physical exam, no mass is palpated in the medial right breast in
the 9 o'clock region..

Targeted ultrasound is performed, showing a cluster of cysts
containing two echogenic foci consistent with the calcifications
seen at mammography. This is identified at 9 o'clock position 4 cm
from the nipple and measures 0.8 x 0.3 x 0.4 cm. There is no
internal vascular flow. Additional ultrasound of the medial left
breast is negative.
IMPRESSION: Benign cluster of cysts with two internal layering calcifications
account for the questioned asymmetry on recent 2D screening
mammogram. No evidence of malignancy.

RECOMMENDATION:
Screening mammogram in one year.(Code:YM-X-Y2X)

I have discussed the findings and recommendations with the patient.
Results were also provided in writing at the conclusion of the
visit. If applicable, a reminder letter will be sent to the patient
regarding the next appointment.

BI-RADS CATEGORY  2: Benign.

## 2020-03-22 ENCOUNTER — Other Ambulatory Visit: Payer: Self-pay

## 2020-03-22 ENCOUNTER — Encounter: Payer: Self-pay | Admitting: Dermatology

## 2020-03-22 ENCOUNTER — Ambulatory Visit (INDEPENDENT_AMBULATORY_CARE_PROVIDER_SITE_OTHER): Payer: Managed Care, Other (non HMO) | Admitting: Dermatology

## 2020-03-22 DIAGNOSIS — D225 Melanocytic nevi of trunk: Secondary | ICD-10-CM

## 2020-03-22 DIAGNOSIS — L570 Actinic keratosis: Secondary | ICD-10-CM | POA: Diagnosis not present

## 2020-03-23 ENCOUNTER — Encounter: Payer: Self-pay | Admitting: Dermatology

## 2020-03-23 NOTE — Addendum Note (Signed)
Addended by: Lavonna Monarch on: 03/23/2020 05:13 PM   Modules accepted: Level of Service

## 2020-03-23 NOTE — Progress Notes (Addendum)
   Follow-Up Visit   Subjective  Mikayla Wagner is a 59 y.o. female who presents for the following: Skin Problem (SCAB ON NOSE FELL OF TODAY, PRESENT FOR MONTHS).  Crust Location: Tip of nose Duration: Several months Quality: Top fell off yesterday Associated Signs/Symptoms: Somewhat sensitive Modifying Factors:  Severity:  Timing: Context:   The following portions of the chart were reviewed this encounter and updated as appropriate: Tobacco  Allergies  Meds  Problems  Med Hx  Surg Hx  Fam Hx      Objective  Well appearing patient in no apparent distress; mood and affect are within normal limits.  Focused examination face.   Assessment & Plan  AK (actinic keratosis) Mid Tip of Nose  5-second LN2 freeze; recheck in 1 month biopsy if not improved  Destruction of lesion - Mid Tip of Nose  Destruction method: cryotherapy   Informed consent: discussed and consent obtained   Lesion destroyed using liquid nitrogen: Yes   Cryotherapy cycles:  5 Outcome: patient tolerated procedure well with no complications    Nevus Mid Back  Self examined twice annually, general skin check annually

## 2020-04-23 ENCOUNTER — Ambulatory Visit (INDEPENDENT_AMBULATORY_CARE_PROVIDER_SITE_OTHER): Payer: Managed Care, Other (non HMO) | Admitting: Dermatology

## 2020-04-23 ENCOUNTER — Other Ambulatory Visit: Payer: Self-pay

## 2020-04-23 DIAGNOSIS — Z1283 Encounter for screening for malignant neoplasm of skin: Secondary | ICD-10-CM

## 2020-04-23 DIAGNOSIS — D229 Melanocytic nevi, unspecified: Secondary | ICD-10-CM

## 2020-04-23 DIAGNOSIS — L570 Actinic keratosis: Secondary | ICD-10-CM | POA: Diagnosis not present

## 2020-04-23 DIAGNOSIS — L309 Dermatitis, unspecified: Secondary | ICD-10-CM | POA: Diagnosis not present

## 2020-04-23 DIAGNOSIS — D1801 Hemangioma of skin and subcutaneous tissue: Secondary | ICD-10-CM

## 2020-04-23 MED ORDER — TRIAMCINOLONE ACETONIDE 0.1 % EX CREA: 1.0000 "application " | TOPICAL_CREAM | Freq: Every day | CUTANEOUS | 2 refills | Status: DC | PRN

## 2020-04-23 NOTE — Progress Notes (Signed)
   Follow-Up Visit   Subjective  Mikayla Wagner is a 59 y.o. female who presents for the following: Follow-up (re check nose ak treated with liquid nitrogen.  ) and Annual Exam (Patient wants skin check. no concerns).  Actinic keratosis Location: Nose Duration:  Quality:  Associated Signs/Symptoms: Modifying Factors: Previous freezing Severity:  Timing: Context: Wants moles checked  The following portions of the chart were reviewed this encounter and updated as appropriate:     Objective  Well appearing patient in no apparent distress; mood and affect are within normal limits.  A full examination was performed including scalp, head, eyes, ears, nose, lips, neck, chest, axillae, abdomen, back, buttocks, bilateral upper extremities, bilateral lower extremities, hands, feet, fingers, toes, fingernails, and toenails. All findings within normal limits unless otherwise noted below.   Assessment & Plan  Eczema, unspecified type (3) Chest - Medial [facility]); Left Inframammary Fold; Right Inframammary Fold  triamcinolone cream (KENALOG) 0.1 % - Chest - Medial (Center), Left Inframammary Fold, Right Inframammary Fold  General skin check for Mikayla Wagner date of birth 06-29-1961.  The precancer on the tip of her nose is now perfectly smooth and requires no biopsy and no second freeze.  There is a small crust on the right inner orbit which she feels is relatively recent and may be part of her eczema.  She can apply some over-the-counter hydrocortisone ointment nightly for 1 week.  If this bump does not disappear in 10 weeks then she will return for possible biopsy.  She has multiple benign angiomas including one much darker one on the left thigh; dermoscopy shows that is definitely a benign vascular growth.  The nonspecific bump on her right shin now has a bit of dermal firmness suggestive of a dermatofibroma and requires no intervention.  She had felt a spot on her right shoulder which I could not  find today.  On the back of her left shoulder is a tan cobblestone clinically benign lesion which requires no intervention.  There are pink bumps scattered on the chest and abdomen which may be mild Grovers disease.  Since there is no itching we will do no intervention, but she will have a prescription for triamcinolone cream for this oral area and her historical eczema if this becomes bothersome.  If all goes well routine follow-up in 1 year. Skin cancer screening performed today.

## 2020-05-30 ENCOUNTER — Encounter: Payer: Self-pay | Admitting: Dermatology

## 2020-08-15 ENCOUNTER — Encounter: Payer: Managed Care, Other (non HMO) | Admitting: Family

## 2020-09-05 ENCOUNTER — Ambulatory Visit (INDEPENDENT_AMBULATORY_CARE_PROVIDER_SITE_OTHER): Payer: Managed Care, Other (non HMO) | Admitting: Family

## 2020-09-05 ENCOUNTER — Encounter: Payer: Self-pay | Admitting: Family

## 2020-09-05 ENCOUNTER — Other Ambulatory Visit: Payer: Self-pay

## 2020-09-05 VITALS — BP 120/86 | HR 83 | Temp 97.6°F | Ht 62.0 in | Wt 153.0 lb

## 2020-09-05 DIAGNOSIS — F419 Anxiety disorder, unspecified: Secondary | ICD-10-CM | POA: Diagnosis not present

## 2020-09-05 DIAGNOSIS — Z0000 Encounter for general adult medical examination without abnormal findings: Secondary | ICD-10-CM | POA: Diagnosis not present

## 2020-09-05 DIAGNOSIS — F988 Other specified behavioral and emotional disorders with onset usually occurring in childhood and adolescence: Secondary | ICD-10-CM | POA: Insufficient documentation

## 2020-09-05 DIAGNOSIS — Z23 Encounter for immunization: Secondary | ICD-10-CM

## 2020-09-05 NOTE — Patient Instructions (Addendum)
Labs with [facility]  This is  Dr. Lupita Dawn  example of a  "Low GI"  Diet:  It will allow you to lose 4 to 8  lbs  per month if you follow it carefully.  Your goal with exercise is a minimum of 30 minutes of aerobic exercise 5 days per week (Walking does not count once it becomes easy!)    All of the foods can be found at grocery stores and in bulk at [employer].  The Atkins protein bars and shakes are available in more varieties at [employer], [employer] and [place].     7 AM Breakfast:  Choose from the following:  Low carbohydrate Protein  Shakes (I recommend the  Premier Protein chocolate shakes,  EAS AdvantEdge "Carb Control" shakes  Or the Atkins shakes all are under 3 net carbs)     a scrambled egg/bacon/cheese burrito made with Mission's "carb balance" whole wheat tortilla  (about 10 net carbs )  Regulatory affairs officer (basically a quiche without the pastry crust) that is eaten cold and very convenient way to get your eggs.  8 carbs)  If you make your own protein shakes, avoid bananas and pineapple,  And use low carb greek yogurt or original /unsweetened almond or soy milk    Avoid cereal and bananas, oatmeal and cream of wheat and grits. They are loaded with carbohydrates!   10 AM: high protein snack:  Protein bar by Atkins (the snack size, under 200 cal, usually < 6 net carbs).    A stick of cheese:  Around 1 carb,  100 cal     Dannon Light n Fit [place] Yogurt  (80 cal, 8 carbs)  Other so called "protein bars" and Greek yogurts tend to be loaded with carbohydrates.  Remember, in food advertising, the word "energy" is synonymous for " carbohydrate."  Lunch:   A Sandwich using the bread choices listed, Can use any  Eggs,  lunchmeat, grilled meat or canned tuna), avocado, regular mayo/mustard  and cheese.  A Salad using blue cheese, ranch,  Goddess or vinagrette,  Avoid taco shells, croutons or "confetti" and no "candied nuts" but regular nuts OK.   No pretzels, nabs  or  chips.  Pickles and miniature sweet peppers are a good low carb alternative that provide a "crunch"  The bread is the only source of carbohydrate in a sandwich and  can be decreased by trying some of the attached alternatives to traditional loaf bread   Avoid "Low fat dressings, as well as [place] dressings They are loaded with sugar!   3 PM/ Mid day  Snack:  Consider  1 ounce of  almonds, walnuts, pistachios, pecans, peanuts,  Macadamia nuts or a nut medley.  Avoid "granola and granola bars "  Mixed nuts are ok in moderation as long as there are no raisins,  cranberries or dried fruit.   KIND bars are OK if you get the low glycemic index variety   Try the prosciutto/mozzarella cheese sticks by Fiorruci  In deli /backery section   High protein      6 PM  Dinner:     Meat/fowl/fish with a green salad, and either broccoli, cauliflower, green beans, spinach, brussel sprouts or  Lima beans. DO NOT BREAD THE PROTEIN!!      There is a low carb pasta by Dreamfield's that is acceptable and tastes great: only 5 digestible carbs/serving.( All grocery stores but BJs carry it ) Several  ready made meals are available low carb:   Try Michel Angelo's chicken piccata or chicken or eggplant parm over low carb pasta.(Lowes and BJs)   Marjory Lies Sanchez's "Carnitas" (pulled pork, no sauce,  0 carbs) or his beef pot roast to make a dinner burrito (at [employer]J's)  Pesto over low carb pasta (bj's sells a good quality pesto in the center refrigerated section of the deli   Try satueeing  Cheral Marker with mushroooms as a good side   [employer] Giant makes a mashed cauliflower that tastes like mashed potatoes  Whole wheat pasta is still full of digestible carbs and  Not as low in glycemic index as Dreamfield's.   Brown rice is still rice,  So skip the rice and noodles if you eat [place] or [place] (or at least limit to 1/2 cup)  9 PM snack :   Breyer's "low carb" fudgsicle or  ice cream bar (Carb Smart line), or   Weight Watcher's ice cream bar , or another "no sugar added" ice cream;  a serving of fresh berries/cherries with whipped cream   Cheese or DANNON'S LlGHT N FIT GREEK YOGURT  8 ounces of [place] unsweetened almond/cococunut milk    Treat yourself to a parfait made with whipped cream blueberiies, walnuts and vanilla greek yogurt  Avoid bananas, pineapple, grapes  and watermelon on a regular basis because they are high in sugar.  THINK OF THEM AS DESSERT  Remember that snack Substitutions should be less than 10 NET carbs per serving and meals < 20 carbs. Remember to subtract fiber grams to get the "net carbs."  Health Maintenance for Postmenopausal Women Menopause is a normal process in which your ability to get pregnant comes to an end. This process happens slowly over many months or years, usually between the ages of 71 and 49. Menopause is complete when you have missed your menstrual periods for 12 months. It is important to talk with your health care provider about some of the most common conditions that affect women after menopause (postmenopausal women). These include heart disease, cancer, and bone loss (osteoporosis). Adopting a healthy lifestyle and getting preventive care can help to promote your health and wellness. The actions you take can also lower your chances of developing some of these common conditions. What should I know about menopause? During menopause, you may get a number of symptoms, such as:  Hot flashes. These can be moderate or severe.  Night sweats.  Decrease in sex drive.  Mood swings.  Headaches.  Tiredness.  Irritability.  Memory problems.  Insomnia. Choosing to treat or not to treat these symptoms is a decision that you make with your health care provider. Do I need hormone replacement therapy?  Hormone replacement therapy is effective in treating symptoms that are caused by menopause, such as hot flashes and night sweats.  Hormone replacement  carries certain risks, especially as you become older. If you are thinking about using estrogen or estrogen with progestin, discuss the benefits and risks with your health care provider. What is my risk for heart disease and stroke? The risk of heart disease, heart attack, and stroke increases as you age. One of the causes may be a change in the body's hormones during menopause. This can affect how your body uses dietary fats, triglycerides, and cholesterol. Heart attack and stroke are medical emergencies. There are many things that you can do to help prevent heart disease and stroke. Watch your blood pressure  High blood pressure causes  heart disease and increases the risk of stroke. This is more likely to develop in people who have high blood pressure readings, are of African descent, or are overweight.  Have your blood pressure checked: ? Every 3-5 years if you are 43-35 years of age. ? Every year if you are 60 years old or older. Eat a healthy diet   Eat a diet that includes plenty of vegetables, fruits, low-fat dairy products, and lean protein.  Do not eat a lot of foods that are high in solid fats, added sugars, or sodium. Get regular exercise Get regular exercise. This is one of the most important things you can do for your health. Most adults should:  Try to exercise for at least 150 minutes each week. The exercise should increase your heart rate and make you sweat (moderate-intensity exercise).  Try to do strengthening exercises at least twice each week. Do these in addition to the moderate-intensity exercise.  Spend less time sitting. Even light physical activity can be beneficial. Other tips  Work with your health care provider to achieve or maintain a healthy weight.  Do not use any products that contain nicotine or tobacco, such as cigarettes, e-cigarettes, and chewing tobacco. If you need help quitting, ask your health care provider.  Know your numbers. Ask your health  care provider to check your cholesterol and your blood sugar (glucose). Continue to have your blood tested as directed by your health care provider. Do I need screening for cancer? Depending on your health history and family history, you may need to have cancer screening at different stages of your life. This may include screening for:  Breast cancer.  Cervical cancer.  Lung cancer.  Colorectal cancer. What is my risk for osteoporosis? After menopause, you may be at increased risk for osteoporosis. Osteoporosis is a condition in which bone destruction happens more quickly than new bone creation. To help prevent osteoporosis or the bone fractures that can happen because of osteoporosis, you may take the following actions:  If you are 68-71 years old, get at least 1,000 mg of calcium and at least 600 mg of vitamin D per day.  If you are older than age 30 but younger than age 44, get at least 1,200 mg of calcium and at least 600 mg of vitamin D per day.  If you are older than age 28, get at least 1,200 mg of calcium and at least 800 mg of vitamin D per day. Smoking and drinking excessive alcohol increase the risk of osteoporosis. Eat foods that are rich in calcium and vitamin D, and do weight-bearing exercises several times each week as directed by your health care provider. How does menopause affect my mental health? Depression may occur at any age, but it is more common as you become older. Common symptoms of depression include:  Low or sad mood.  Changes in sleep patterns.  Changes in appetite or eating patterns.  Feeling an overall lack of motivation or enjoyment of activities that you previously enjoyed.  Frequent crying spells. Talk with your health care provider if you think that you are experiencing depression. General instructions See your health care provider for regular wellness exams and vaccines. This may include:  Scheduling regular health, dental, and eye  exams.  Getting and maintaining your vaccines. These include: ? Influenza vaccine. Get this vaccine each year before the flu season begins. ? Pneumonia vaccine. ? Shingles vaccine. ? Tetanus, diphtheria, and pertussis (Tdap) booster vaccine. Your health care provider  may also recommend other immunizations. Tell your health care provider if you have ever been abused or do not feel safe at home. Summary  Menopause is a normal process in which your ability to get pregnant comes to an end.  This condition causes hot flashes, night sweats, decreased interest in sex, mood swings, headaches, or lack of sleep.  Treatment for this condition may include hormone replacement therapy.  Take actions to keep yourself healthy, including exercising regularly, eating a healthy diet, watching your weight, and checking your blood pressure and blood sugar levels.  Get screened for cancer and depression. Make sure that you are up to date with all your vaccines. This information is not intended to replace advice given to you by your health care provider. Make sure you discuss any questions you have with your health care provider. Document Revised: 10/20/2018 Document Reviewed: 10/20/2018 Elsevier Patient Education  2020 [employer].

## 2020-09-05 NOTE — Progress Notes (Signed)
Subjective:    Patient ID: Mikayla Wagner, female    DOB: 01/29/1961, 59 y.o.   MRN: [id number]  CC: Mikayla Wagner is a 59 y.o. female who presents today for physical exam.    HPI: Working on weight loss , has lost about 12lbs. Walking about 3 miles per day. Breakfast is protein bar, boiled egg. Skips lunch. Dinner may be pizza, soup and crackers.   Depression- feels well on wellbutrin. Not sure if not helpful and has considered stopping. Sleeping well. No si/hi.  No h/o seizure, eating disorder. No heavy alcohol use.      Colorectal Cancer Screening: UTD .Follows with Dr Bary Castilla 2018; repeat in 10 years Breast Cancer Screening: Mammogram UTD Cervical Cancer Screening:adament that she has not had a history of uterine cancer. H/o hysterectomy. No cervix.   Bone Health screening/DEXA for 65+: No increased fracture risk. Defer screening at this time.  Lung Cancer Screening: Doesn't have 30 year pack year history and age > 73 years yo 46 years  No  family history of AAA.        Tetanus - due         Labs: Screening labs today. Exercise: Gets regular exercise.   Alcohol use:  socially Smoking/tobacco use: Nonsmoker.     HISTORY:  Past Medical History:  Diagnosis Date  . Abnormal mammogram    2013, plan to repeat mammogram 05/2013  . Ovarian cyst, complex 2010   s/p removal    Past Surgical History:  Procedure Laterality Date  . ABDOMINAL HYSTERECTOMY     NO UTERINE CANCER; no cervix and no ovaries.   . APPENDECTOMY    . AUGMENTATION MAMMAPLASTY Bilateral 2006   saline  . BREAST ENHANCEMENT SURGERY  2006  . CESAREAN SECTION     4x  . COLON SURGERY    . COLONOSCOPY WITH PROPOFOL N/A 12/10/2016   Procedure: COLONOSCOPY WITH PROPOFOL;  Surgeon: Robert Bellow, MD;  Location: [facility] ENDOSCOPY;  Service: Endoscopy;  Laterality: N/A;  . DILATION AND CURETTAGE OF UTERUS  2005  . PARTIAL COLECTOMY  2005   ruptured diverticuli, s/p colostomy and reversal  . SCAR REVISION   2011   Family History  Problem Relation Age of Onset  . Cancer Paternal Aunt        ovarian cancer      ALLERGIES: Codeine, Erythromycin, Morphine sulfate, and Latex  Current Outpatient Medications on File Prior to Visit  Medication Sig Dispense Refill  . buPROPion (WELLBUTRIN XL) 300 MG 24 hr tablet TAKE 1 TABLET BY MOUTH IN  THE MORNING 90 tablet 3   Current Facility-Administered Medications on File Prior to Visit  Medication Dose Route Frequency Provider Last Rate Last Admin  . cyanocobalamin ((VITAMIN B-12)) injection 1,000 mcg  1,000 mcg Intramuscular Once Jackolyn Confer, MD        Social History   Tobacco Use  . Smoking status: Never Smoker  . Smokeless tobacco: Never Used  Substance Use Topics  . Alcohol use: Yes    Comment: socially  . Drug use: No    Review of Systems  Constitutional: Negative for chills, fever and unexpected weight change.  HENT: Negative for congestion.   Respiratory: Negative for cough.   Cardiovascular: Negative for chest pain, palpitations and leg swelling.  Gastrointestinal: Negative for nausea and vomiting.  Musculoskeletal: Negative for arthralgias and myalgias.  Skin: Negative for rash.  Neurological: Negative for headaches.  Hematological: Negative for adenopathy.  Psychiatric/Behavioral: Negative for  confusion, sleep disturbance and suicidal ideas. The patient is not nervous/anxious.       Objective:    BP 120/86   Pulse 83   Temp 97.6 F (36.4 C)   Ht 5\' 2"  (1.575 m)   Wt 153 lb (69.4 kg)   SpO2 98%   BMI 27.98 kg/m   BP Readings from Last 3 Encounters:  09/05/20 120/86  05/23/19 128/88  02/05/18 124/74   Wt Readings from Last 3 Encounters:  09/05/20 153 lb (69.4 kg)  05/23/19 146 lb 6.4 oz (66.4 kg)  02/05/18 151 lb 2 oz (68.5 kg)    Physical Exam Vitals reviewed.  Constitutional:      Appearance: She is well-developed.  Eyes:     Conjunctiva/sclera: Conjunctivae normal.  Neck:     Thyroid: No thyroid  mass or thyromegaly.  Cardiovascular:     Rate and Rhythm: Normal rate and regular rhythm.     Pulses: Normal pulses.     Heart sounds: Normal heart sounds.  Pulmonary:     Effort: Pulmonary effort is normal.     Breath sounds: Normal breath sounds. No wheezing, rhonchi or rales.  Chest:     Breasts: Breasts are symmetrical.        Right: No inverted nipple, mass, nipple discharge, skin change or tenderness.        Left: No inverted nipple, mass, nipple discharge, skin change or tenderness.  Lymphadenopathy:     Head:     Right side of head: No submental, submandibular, tonsillar, preauricular, posterior auricular or occipital adenopathy.     Left side of head: No submental, submandibular, tonsillar, preauricular, posterior auricular or occipital adenopathy.     Cervical: No cervical adenopathy.     Right cervical: No superficial, deep or posterior cervical adenopathy.    Left cervical: No superficial, deep or posterior cervical adenopathy.  Skin:    General: Skin is warm and dry.  Neurological:     Mental Status: She is alert.  Psychiatric:        Speech: Speech normal.        Behavior: Behavior normal.        Thought Content: Thought content normal.        Assessment & Plan:   Problem List Items Addressed This Visit      Other   Anxiety    Stable, she declines weaning off wellbutrin at this time.continue wellbutrin 300mg       Routine general medical examination at a health care facility - Primary    CBE performed. Discussed goal of BMI < 25 and congratulated patient on weight loss. Discussed low glycemic diet. Patient declines pelvic exam in the absence of complaints and states no GYN cancer, and no cervix.       Relevant Orders   TSH   CBC with Differential/Platelet   Comprehensive metabolic panel   Hemoglobin A1c   Lipid panel   VITAMIN D 25 Hydroxy (Vit-D Deficiency, Fractures)   Anti-parietal antibody   B12 and Folate Panel   Intrinsic Factor Antibodies      Other Visit Diagnoses    Need for Tdap vaccination       Relevant Orders   Tdap vaccine greater than or equal to 7yo IM (Completed)       I have discontinued Audrie Gallus. Radabaugh's ibuprofen, hyoscyamine, nitrofurantoin (macrocrystal-monohydrate), Cholecalciferol, and triamcinolone cream. I am also having her maintain her buPROPion. We will continue to administer cyanocobalamin.   No orders of the  defined types were placed in this encounter.   Return precautions given.   Risks, benefits, and alternatives of the medications and treatment plan prescribed today were discussed, and patient expressed understanding.   Education regarding symptom management and diagnosis given to patient on AVS.   Continue to follow with Burnard Hawthorne, FNP for routine health maintenance.   Alden Server and I agreed with plan.   Mable Paris, FNP

## 2020-09-11 ENCOUNTER — Encounter: Payer: Self-pay | Admitting: Family

## 2020-09-11 NOTE — Assessment & Plan Note (Signed)
Stable, she declines weaning off wellbutrin at this time.continue wellbutrin 300mg 

## 2020-09-11 NOTE — Assessment & Plan Note (Signed)
CBE performed. Discussed goal of BMI < 25 and congratulated patient on weight loss. Discussed low glycemic diet. Patient declines pelvic exam in the absence of complaints and states no GYN cancer, and no cervix.

## 2020-10-15 ENCOUNTER — Encounter: Payer: Self-pay | Admitting: Family

## 2020-10-15 ENCOUNTER — Other Ambulatory Visit: Payer: Self-pay | Admitting: Family

## 2020-10-15 DIAGNOSIS — F32A Depression, unspecified: Secondary | ICD-10-CM

## 2020-10-15 MED ORDER — BUPROPION HCL ER (XL) 150 MG PO TB24: 150.0000 mg | ORAL_TABLET | Freq: Every day | ORAL | 1 refills | Status: DC

## 2020-10-16 ENCOUNTER — Other Ambulatory Visit: Payer: Self-pay

## 2020-10-16 DIAGNOSIS — F32A Depression, unspecified: Secondary | ICD-10-CM

## 2020-10-16 MED ORDER — BUPROPION HCL ER (XL) 150 MG PO TB24: 150.0000 mg | ORAL_TABLET | Freq: Every day | ORAL | 1 refills | Status: DC

## 2020-12-06 ENCOUNTER — Other Ambulatory Visit: Payer: Self-pay | Admitting: Family

## 2020-12-06 DIAGNOSIS — Z1231 Encounter for screening mammogram for malignant neoplasm of breast: Secondary | ICD-10-CM

## 2020-12-11 ENCOUNTER — Encounter: Payer: Self-pay | Admitting: Family

## 2020-12-14 ENCOUNTER — Other Ambulatory Visit: Payer: Self-pay | Admitting: Family

## 2020-12-14 DIAGNOSIS — M8580 Other specified disorders of bone density and structure, unspecified site: Secondary | ICD-10-CM

## 2020-12-26 ENCOUNTER — Ambulatory Visit
Admission: RE | Admit: 2020-12-26 | Discharge: 2020-12-26 | Disposition: A | Discharge status: Home / Self Care | Payer: Managed Care, Other (non HMO) | Source: Ambulatory Visit | Attending: Family | Admitting: Family

## 2020-12-26 ENCOUNTER — Other Ambulatory Visit: Payer: Self-pay

## 2020-12-26 DIAGNOSIS — Z1231 Encounter for screening mammogram for malignant neoplasm of breast: Secondary | ICD-10-CM

## 2021-01-01 ENCOUNTER — Ambulatory Visit
Admission: RE | Admit: 2021-01-01 | Discharge: 2021-01-01 | Disposition: A | Discharge status: Home / Self Care | Payer: Managed Care, Other (non HMO) | Source: Ambulatory Visit | Attending: Family | Admitting: Family

## 2021-01-01 ENCOUNTER — Other Ambulatory Visit: Payer: Self-pay

## 2021-01-01 DIAGNOSIS — M8580 Other specified disorders of bone density and structure, unspecified site: Secondary | ICD-10-CM | POA: Diagnosis not present

## 2021-01-07 ENCOUNTER — Ambulatory Visit (INDEPENDENT_AMBULATORY_CARE_PROVIDER_SITE_OTHER): Payer: Managed Care, Other (non HMO) | Admitting: Family

## 2021-01-07 ENCOUNTER — Encounter: Payer: Self-pay | Admitting: Family

## 2021-01-07 ENCOUNTER — Other Ambulatory Visit: Payer: Self-pay

## 2021-01-07 DIAGNOSIS — K219 Gastro-esophageal reflux disease without esophagitis: Secondary | ICD-10-CM

## 2021-01-07 DIAGNOSIS — F32A Depression, unspecified: Secondary | ICD-10-CM | POA: Diagnosis not present

## 2021-01-07 DIAGNOSIS — F419 Anxiety disorder, unspecified: Secondary | ICD-10-CM | POA: Diagnosis not present

## 2021-01-07 MED ORDER — BUPROPION HCL ER (XL) 150 MG PO TB24: 150.0000 mg | ORAL_TABLET | Freq: Every day | ORAL | 1 refills | Status: DC

## 2021-01-07 NOTE — Progress Notes (Signed)
Subjective:    Patient ID: Mikayla Wagner, female    DOB: 1961/10/29, 60 y.o.   MRN: [id number]  CC: Mikayla Wagner is a 60 y.o. female who presents today for follow up.   HPI: Feels well today.   Mikayla Wagner has reduced wellbutrin from 300mg  to 150mg . Mikayla Wagner feels less anxious. No depression. Mikayla Wagner feels medication  Helps Mikayla Wagner stay less irritable.  Mikayla Wagner had labs done with [facility] for wellness. Mikayla Wagner states 'normal, within range.' Mikayla Wagner declines annual labs done here today.   Occasionally will have epigastric burning. Mikayla Wagner would like a prescription for pepcid ac as this has worked for Mikayla Wagner in the past. Tums relieves discomfort  No cp, vomiting, unusual weight loss, trouble swallowing.    HISTORY:  Past Medical History:  Diagnosis Date  . Abnormal mammogram    2013, plan to repeat mammogram 05/2013  . Ovarian cyst, complex 2010   s/p removal   Past Surgical History:  Procedure Laterality Date  . ABDOMINAL HYSTERECTOMY     NO UTERINE CANCER; no cervix and no ovaries. Declines pap smear.   . APPENDECTOMY    . AUGMENTATION MAMMAPLASTY Bilateral 2006   saline  . BREAST ENHANCEMENT SURGERY  2006  . CESAREAN SECTION     4x  . COLON SURGERY    . COLONOSCOPY WITH PROPOFOL N/A 12/10/2016   Procedure: COLONOSCOPY WITH PROPOFOL;  Surgeon: Robert Bellow, MD;  Location: [facility] ENDOSCOPY;  Service: Endoscopy;  Laterality: N/A;  . DILATION AND CURETTAGE OF UTERUS  2005  . PARTIAL COLECTOMY  2005   ruptured diverticuli, s/p colostomy and reversal  . SCAR REVISION  2011   Family History  Problem Relation Age of Onset  . Cancer Paternal Aunt        ovarian cancer    Allergies: Codeine, Erythromycin, Morphine sulfate, and Latex No current outpatient medications on file prior to visit.   Current Facility-Administered Medications on File Prior to Visit  Medication Dose Route Frequency Provider Last Rate Last Admin  . cyanocobalamin ((VITAMIN B-12)) injection 1,000 mcg  1,000 mcg Intramuscular Once  Jackolyn Confer, MD        Social History   Tobacco Use  . Smoking status: Never Smoker  . Smokeless tobacco: Never Used  Substance Use Topics  . Alcohol use: Yes    Comment: socially  . Drug use: No    Review of Systems  Constitutional: Negative for chills and fever.  HENT: Negative for trouble swallowing.   Respiratory: Negative for cough.   Cardiovascular: Negative for chest pain and palpitations.  Gastrointestinal: Negative for abdominal distention, abdominal pain, nausea and vomiting.      Objective:    BP 120/82   Pulse 89   Temp (!) 97.4 F (36.3 C)   Ht 5\' 2"  (1.575 m)   Wt 156 lb 3.2 oz (70.9 kg)   SpO2 99%   BMI 28.57 kg/m  BP Readings from Last 3 Encounters:  01/07/21 120/82  09/05/20 120/86  05/23/19 128/88   Wt Readings from Last 3 Encounters:  01/07/21 156 lb 3.2 oz (70.9 kg)  09/05/20 153 lb (69.4 kg)  05/23/19 146 lb 6.4 oz (66.4 kg)    Physical Exam Vitals reviewed.  Constitutional:      Appearance: Mikayla Wagner is well-developed and well-nourished.  Eyes:     Conjunctiva/sclera: Conjunctivae normal.  Cardiovascular:     Rate and Rhythm: Normal rate and regular rhythm.     Pulses: Normal pulses.  Heart sounds: Normal heart sounds.  Pulmonary:     Effort: Pulmonary effort is normal.     Breath sounds: Normal breath sounds. No wheezing, rhonchi or rales.  Skin:    General: Skin is warm and dry.  Neurological:     Mental Status: Mikayla Wagner is alert.  Psychiatric:        Mood and Affect: Mood and affect normal.        Speech: Speech normal.        Behavior: Behavior normal.        Thought Content: Thought content normal.        Assessment & Plan:   Problem List Items Addressed This Visit      Digestive   GERD (gastroesophageal reflux disease)    Asymptomatic today. Advised prn pepcid ac. Mikayla Wagner have prescribed      Relevant Medications   famotidine (PEPCID) 20 MG tablet     Other   Anxiety    Improved with decrease of wellbutrin to  150mg . We discussed weaning off medication in its entirety however Mikayla Wagner prefers to stay on low dose. Continue wellbutrin.      Relevant Medications   buPROPion (WELLBUTRIN XL) 150 MG 24 hr tablet   Depression   Relevant Medications   buPROPion (WELLBUTRIN XL) 150 MG 24 hr tablet       Mikayla Wagner am having Mikayla Wagner start on famotidine. Mikayla Wagner am also having Mikayla Wagner maintain Mikayla Wagner buPROPion. We will continue to administer cyanocobalamin.   Meds ordered this encounter  Medications  . buPROPion (WELLBUTRIN XL) 150 MG 24 hr tablet    Sig: Take 1 tablet (150 mg total) by mouth daily with breakfast.    Dispense:  90 tablet    Refill:  1    Order Specific Question:   Supervising Provider    Answer:   Deborra Medina L [[id number]]  . famotidine (PEPCID) 20 MG tablet    Sig: Take 1 tablet (20 mg total) by mouth 2 (two) times daily as needed for heartburn or indigestion.    Dispense:  60 tablet    Refill:  1    Order Specific Question:   Supervising Provider    Answer:   Crecencio Mc [[id number]]    Return precautions given.   Risks, benefits, and alternatives of the medications and treatment plan prescribed today were discussed, and patient expressed understanding.   Education regarding symptom management and diagnosis given to patient on AVS.  Continue to follow with Burnard Hawthorne, FNP for routine health maintenance.   Alden Server and Mikayla Wagner agreed with plan.   Mable Paris, FNP

## 2021-01-08 ENCOUNTER — Other Ambulatory Visit: Payer: Self-pay

## 2021-01-08 ENCOUNTER — Other Ambulatory Visit: Payer: Self-pay | Admitting: Family

## 2021-01-08 DIAGNOSIS — N390 Urinary tract infection, site not specified: Secondary | ICD-10-CM

## 2021-01-08 DIAGNOSIS — K219 Gastro-esophageal reflux disease without esophagitis: Secondary | ICD-10-CM

## 2021-01-08 MED ORDER — NITROFURANTOIN MONOHYD MACRO 100 MG PO CAPS: 100.0000 mg | ORAL_CAPSULE | Freq: Every day | ORAL | 1 refills | Status: DC | PRN

## 2021-01-08 MED ORDER — FAMOTIDINE 20 MG PO TABS: 20.0000 mg | ORAL_TABLET | Freq: Two times a day (BID) | ORAL | 1 refills | Status: DC | PRN

## 2021-01-08 NOTE — Assessment & Plan Note (Signed)
Improved with decrease of wellbutrin to 150mg . We discussed weaning off medication in its entirety however she prefers to stay on low dose. Continue wellbutrin.

## 2021-01-08 NOTE — Assessment & Plan Note (Signed)
Asymptomatic today. Advised prn pepcid ac. I have prescribed

## 2021-04-09 ENCOUNTER — Encounter: Payer: Self-pay | Admitting: Family

## 2021-04-11 ENCOUNTER — Telehealth: Payer: Self-pay

## 2021-04-11 NOTE — Telephone Encounter (Signed)
Just FYI. Patient stated that she tested positive for Covid & wanted VV to have Paxlovid sent in. I let patient know that we were fully booked & would have to advise UC. She said that she flat refused to go. She said that she was worried that she may have worsening symptoms. Right now all is mild. Mild cough, stuffiness in head & stated she felt like she had a head cold. She was having no SOB & was actually still working from home she said. I let her know that if mild that medication may not be prescribed or recommended. I also let her know that Paxlovid did come with many side effects. I advised that she take Mucinex D (green label, tylenol/ibuprofen if fever developed, Robitussin DM as well as vitamin list we give to patients. I let her know that she could check back tomorrow to see if any cancellations for VV. If she developed SOB that she would need to go to ED ASAP. Patient verbalized understanding of this.

## 2021-04-12 NOTE — Telephone Encounter (Signed)
I have spoken with patient & advised again that if worsening symptoms UC. She said that she was just having nasal congestion mainly. She said that she would consider doing the mychart e-visit option. As of now things were still mild. She was advised of the 5 day window for the Paxlovid.

## 2021-04-12 NOTE — Telephone Encounter (Signed)
Call pt Please reiterate going to UC . For safety, patient needs an assessment and we no not have an appointment.  Paxlovid has be to be started within 5 days of symptom onset so again I advise UC. She may be able to use Mychart Evisit however Im not sure.

## 2021-04-23 ENCOUNTER — Ambulatory Visit (INDEPENDENT_AMBULATORY_CARE_PROVIDER_SITE_OTHER): Payer: Managed Care, Other (non HMO) | Admitting: Dermatology

## 2021-04-23 ENCOUNTER — Encounter: Payer: Self-pay | Admitting: Dermatology

## 2021-04-23 ENCOUNTER — Other Ambulatory Visit: Payer: Self-pay

## 2021-04-23 DIAGNOSIS — Z1283 Encounter for screening for malignant neoplasm of skin: Secondary | ICD-10-CM | POA: Diagnosis not present

## 2021-04-23 DIAGNOSIS — D0461 Carcinoma in situ of skin of right upper limb, including shoulder: Secondary | ICD-10-CM | POA: Diagnosis not present

## 2021-04-23 DIAGNOSIS — L570 Actinic keratosis: Secondary | ICD-10-CM

## 2021-04-23 DIAGNOSIS — D485 Neoplasm of uncertain behavior of skin: Secondary | ICD-10-CM

## 2021-04-23 NOTE — Progress Notes (Signed)
   Follow-Up Visit   Subjective  Mikayla Wagner is a 60 y.o. female who presents for the following: Annual Exam (No new concerns).  Annual skin examination, several spots to check including growth on right arm Location:  Duration:  Quality:  Associated Signs/Symptoms: Modifying Factors:  Severity:  Timing: Context:   Objective  Well appearing patient in no apparent distress; mood and affect are within normal limits. Right Elbow - Posterior         A full examination was performed including scalp, head, eyes, ears, nose, lips, neck, chest, axillae, abdomen, back, buttocks, bilateral upper extremities, bilateral lower extremities, hands, feet, fingers, toes, fingernails, and toenails. All findings within normal limits unless otherwise noted below.  Areas beneath undergarments not fully examined.   Assessment & Plan    Encounter for screening for malignant neoplasm of skin  AK (actinic keratosis) (6) Right Wrist - Posterior; Left Upper Arm - Posterior; Right Upper Arm - Posterior; Right Forearm - Posterior; Dorsum of Nose; Mid Supratip of Nose  Destruction of lesion - Dorsum of Nose, Left Upper Arm - Posterior, Mid Supratip of Nose, Right Forearm - Posterior, Right Upper Arm - Posterior, Right Wrist - Posterior Complexity: simple   Destruction method: cryotherapy   Informed consent: discussed and consent obtained   Timeout:  patient name, date of birth, surgical site, and procedure verified Lesion destroyed using liquid nitrogen: Yes   Cryotherapy cycles:  3 Outcome: patient tolerated procedure well with no complications    Neoplasm of uncertain behavior of skin Right Elbow - Posterior  Skin / nail biopsy Type of biopsy: tangential   Informed consent: discussed and consent obtained   Timeout: patient name, date of birth, surgical site, and procedure verified   Anesthesia: the lesion was anesthetized in a standard fashion   Anesthetic:  1% lidocaine w/ epinephrine  1-100,000 local infiltration Instrument used: flexible razor blade   Hemostasis achieved with: aluminum chloride and electrodesiccation   Outcome: patient tolerated procedure well   Post-procedure details: wound care instructions given    Destruction of lesion Complexity: simple   Destruction method: cryotherapy   Informed consent: discussed and consent obtained   Timeout:  patient name, date of birth, surgical site, and procedure verified Anesthesia: the lesion was anesthetized in a standard fashion   Anesthetic:  1% lidocaine w/ epinephrine 1-100,000 local infiltration Lesion destroyed using liquid nitrogen: Yes   Cryotherapy cycles:  1 Margin per side (cm):  0.1 Final wound size (cm):  1 Hemostasis achieved with:  aluminum chloride Outcome: patient tolerated procedure well with no complications   Post-procedure details: wound care instructions given    Specimen 1 - Surgical pathology Differential Diagnosis: bcc vs scc  Check Margins: No   LN2 front hairline, 2 nose, one left biceps, one right forearm   I, Lavonna Monarch, MD, have reviewed all documentation for this visit.  The documentation on 04/23/21 for the exam, diagnosis, procedures, and orders are all accurate and complete.

## 2021-04-23 NOTE — Patient Instructions (Signed)

## 2021-04-27 ENCOUNTER — Encounter: Payer: Self-pay | Admitting: Dermatology

## 2021-04-30 ENCOUNTER — Encounter: Payer: Self-pay | Admitting: Dermatology

## 2021-04-30 NOTE — Telephone Encounter (Signed)
Patient called to get biopsy results. Gave her results and made surgery appointment with Dr.Tafeen.

## 2021-04-30 NOTE — Telephone Encounter (Signed)
-----   Message from Lavonna Monarch, MD sent at 04/30/2021  6:02 AM EDT ----- Schedule surgery with Dr. Darene Lamer

## 2021-06-19 ENCOUNTER — Encounter: Payer: Self-pay | Admitting: Dermatology

## 2021-07-10 ENCOUNTER — Ambulatory Visit: Payer: Managed Care, Other (non HMO) | Admitting: Family

## 2021-07-11 ENCOUNTER — Encounter: Payer: Managed Care, Other (non HMO) | Admitting: Dermatology

## 2021-08-01 ENCOUNTER — Encounter: Payer: Managed Care, Other (non HMO) | Admitting: Dermatology

## 2021-09-13 ENCOUNTER — Encounter: Payer: Managed Care, Other (non HMO) | Admitting: Family

## 2021-10-16 ENCOUNTER — Encounter: Payer: Self-pay | Admitting: Family

## 2021-10-16 ENCOUNTER — Other Ambulatory Visit: Payer: Self-pay

## 2021-10-16 ENCOUNTER — Ambulatory Visit (INDEPENDENT_AMBULATORY_CARE_PROVIDER_SITE_OTHER): Payer: Managed Care, Other (non HMO) | Admitting: Family

## 2021-10-16 VITALS — BP 112/68 | HR 62 | Temp 97.5°F | Ht 62.0 in | Wt 164.4 lb

## 2021-10-16 DIAGNOSIS — Z0000 Encounter for general adult medical examination without abnormal findings: Secondary | ICD-10-CM

## 2021-10-16 DIAGNOSIS — E663 Overweight: Secondary | ICD-10-CM | POA: Diagnosis not present

## 2021-10-16 DIAGNOSIS — M7989 Other specified soft tissue disorders: Secondary | ICD-10-CM

## 2021-10-16 MED ORDER — PHENTERMINE HCL 37.5 MG PO CAPS: 37.5000 mg | ORAL_CAPSULE | ORAL | 2 refills | Status: DC

## 2021-10-16 NOTE — Assessment & Plan Note (Addendum)
Uncontrolled. Patient preferernce is phentermine and she politely declines GLP-1 at this time. We discussed mechanism of action and side effects of both phentermine  and Ozempic. As she has done very well on phentermine in the past, we agreed to start phentermine 37.5mg  with close vigilance for side effects. Close follow up.  I looked up patient on [place] Controlled Substances Reporting System PMP AWARE and saw no activity that raised concern of inappropriate use.

## 2021-10-16 NOTE — Progress Notes (Signed)
Subjective:    Patient ID: Mikayla Wagner, female    DOB: 12-24-1960, 60 y.o.   MRN: [id number]  CC: Mikayla Wagner is a 60 y.o. female who presents today for physical exam.    HPI: Complains or right calf, ankle swelling, episodic every 3 weeks. Started over a year ago.  This will occasionally happen on the right leg as well.  Reports it was much better this morning and as the day goes on swelling increases.  She thinks salt is contributory. Improves with elevation. Works while sitting all day.  No recent surgery, long flight /drive, h/o dvt. H/o uterine cancer No sob, calf pain, rash, increased heat.    Frustrated by weight gain. She had been on wellbutrin however no weight loss on medication.  She has been on phentermine 37.5mg  in the past, approx 15 years ago, with good results and prefers to start this dose. She denies palpitations, sob, increased anxiety on this medication.  No h/o arrhthymias.    Declines labs today as she will send labs from [facility] that were done this past year  Colorectal Cancer Screening: UTD, Dr Terri Piedra Breast Cancer Screening: Mammogram UTD , ordered with implant study Cervical Cancer Screening:  history of hysterectomy . She reports no cervix and no ovaries. Declines pap smear.  Bone Health screening/DEXA for 65+: No increased fracture risk. Defer screening at this time.  Lung Cancer Screening: Doesn't have 20 year pack year history and age > 61 years yo 40 years        Tetanus - utd     Exercise: Gets regular exercise with walks daily. No cp.  Alcohol use:  rare Smoking/tobacco use: Nonsmoker.     HISTORY:  Past Medical History:  Diagnosis Date   Abnormal mammogram    2013, plan to repeat mammogram 05/2013   Ovarian cyst, complex 2010   s/p removal    Past Surgical History:  Procedure Laterality Date   ABDOMINAL HYSTERECTOMY     Cysts in uterus and outside of uterus. NO UTERINE CANCER; no cervix and no ovaries. Declines pap smear.    APPENDECTOMY     AUGMENTATION MAMMAPLASTY Bilateral 2006   saline   BREAST ENHANCEMENT SURGERY  2006   CESAREAN SECTION     4x   COLON SURGERY     COLONOSCOPY WITH PROPOFOL N/A 12/10/2016   Procedure: COLONOSCOPY WITH PROPOFOL;  Surgeon: Robert Bellow, MD;  Location: [facility] ENDOSCOPY;  Service: Endoscopy;  Laterality: N/A;   DILATION AND CURETTAGE OF UTERUS  2005   PARTIAL COLECTOMY  2005   ruptured diverticuli, s/p colostomy and reversal   SCAR REVISION  2011   Family History  Problem Relation Age of Onset   Cancer Paternal Aunt        ovarian cancer      ALLERGIES: Codeine, Erythromycin, Morphine sulfate, and Latex  Current Outpatient Medications on File Prior to Visit  Medication Sig Dispense Refill   famotidine (PEPCID) 20 MG tablet TAKE 1 TABLET(20 MG) BY MOUTH TWICE DAILY AS NEEDED FOR HEARTBURN OR INDIGESTION (Patient not taking: Reported on 10/16/2021) 180 tablet 1   nitrofurantoin, macrocrystal-monohydrate, (MACROBID) 100 MG capsule Take 1 capsule (100 mg total) by mouth daily as needed. Take after intercourse. Take with food. (Patient not taking: Reported on 10/16/2021) 30 capsule 1   Current Facility-Administered Medications on File Prior to Visit  Medication Dose Route Frequency Provider Last Rate Last Admin   cyanocobalamin ((VITAMIN B-12)) injection 1,000 mcg  1,000  mcg Intramuscular Once Jackolyn Confer, MD        Social History   Tobacco Use   Smoking status: Never   Smokeless tobacco: Never  Substance Use Topics   Alcohol use: Yes    Comment: socially   Drug use: No    Review of Systems  Constitutional:  Negative for chills, fever and unexpected weight change.  HENT:  Negative for congestion.   Respiratory:  Negative for cough and shortness of breath.   Cardiovascular:  Positive for leg swelling. Negative for chest pain and palpitations.  Gastrointestinal:  Negative for nausea and vomiting.  Musculoskeletal:  Negative for arthralgias and myalgias.   Skin:  Negative for rash.  Neurological:  Negative for headaches.  Hematological:  Negative for adenopathy.  Psychiatric/Behavioral:  Negative for confusion.      Objective:    BP 112/68 (BP Location: Left Arm, Patient Position: Sitting, Cuff Size: Large)   Pulse 62   Temp (!) 97.5 F (36.4 C) (Oral)   Ht 5\' 2"  (1.575 m)   Wt 164 lb 6.4 oz (74.6 kg)   SpO2 99%   BMI 30.07 kg/m   BP Readings from Last 3 Encounters:  10/16/21 112/68  01/07/21 120/82  09/05/20 120/86   Wt Readings from Last 3 Encounters:  10/16/21 164 lb 6.4 oz (74.6 kg)  01/07/21 156 lb 3.2 oz (70.9 kg)  09/05/20 153 lb (69.4 kg)    Physical Exam Vitals reviewed.  Constitutional:      Appearance: She is well-developed.  Eyes:     Conjunctiva/sclera: Conjunctivae normal.  Neck:     Thyroid: No thyroid mass or thyromegaly.  Cardiovascular:     Rate and Rhythm: Normal rate and regular rhythm.     Pulses: Normal pulses.     Heart sounds: Normal heart sounds.     Comments: No pitting edema.  Ever so slight increase in the left calf size as compared to right. No calf tenderness.  No palpable cords or masses. No erythema or increased warmth. LE hair growth symmetric and present. No discoloration or varicosities noted. LE warm and palpable pedal pulses.  Pulmonary:     Effort: Pulmonary effort is normal.     Breath sounds: Normal breath sounds. No wheezing, rhonchi or rales.  Chest:  Breasts:    Breasts are symmetrical.     Right: No inverted nipple, mass, nipple discharge, skin change or tenderness.     Left: No inverted nipple, mass, nipple discharge, skin change or tenderness.  Lymphadenopathy:     Head:     Right side of head: No submental, submandibular, tonsillar, preauricular, posterior auricular or occipital adenopathy.     Left side of head: No submental, submandibular, tonsillar, preauricular, posterior auricular or occipital adenopathy.     Cervical: No cervical adenopathy.     Right  cervical: No superficial, deep or posterior cervical adenopathy.    Left cervical: No superficial, deep or posterior cervical adenopathy.  Skin:    General: Skin is warm and dry.  Neurological:     Mental Status: She is alert.  Psychiatric:        Speech: Speech normal.        Behavior: Behavior normal.        Thought Content: Thought content normal.       Assessment & Plan:   Problem List Items Addressed This Visit       Other   Left leg swelling    Wells criteria low risk  DVT.Advised ultrasound and she politely declines stat US left leg to rule out DVT. She prefers to monitor conservatively with low sodium diet, elevation. She will let me know if worsens or she develops heat, calf pain, erythema      Overweight (BMI 25.0-29.9)    Uncontrolled. Patient preferernce is phentermine and she politely declines GLP-1 at this time. We discussed mechanism of action and side effects of both phentermine  and Ozempic. As she has done very well on phentermine in the past, we agreed to start phentermine 37.5mg  with close vigilance for side effects. Close follow up.  I looked up patient on [place] Controlled Substances Reporting System PMP AWARE and saw no activity that raised concern of inappropriate use.        Relevant Medications   phentermine 37.5 MG capsule   Routine general medical examination at a health care facility    CBE performed and mammogram ordered. Declines screening for cervical cancer as she states she doesn't have a cervix nor pelvic complaints at this time.       Other Visit Diagnoses     Routine physical examination    -  Primary   Relevant Orders   MM DIGITAL SCREENING W/ IMPLANTS BILATERAL   VITAMIN D 25 Hydroxy (Vit-D Deficiency, Fractures)   Hemoglobin A1c   TSH   Lipid panel   CBC with Differential/Platelet   Comprehensive metabolic panel        I have discontinued Audrie Gallus. Prabhakar's buPROPion. I am also having her start on phentermine. Additionally, I am  having her maintain her famotidine and nitrofurantoin (macrocrystal-monohydrate). We will continue to administer cyanocobalamin.   Meds ordered this encounter  Medications   phentermine 37.5 MG capsule    Sig: Take 1 capsule (37.5 mg total) by mouth every morning.    Dispense:  30 capsule    Refill:  2    Order Specific Question:   Supervising Provider    Answer:   Crecencio Mc [[id number]]    Return precautions given.   Risks, benefits, and alternatives of the medications and treatment plan prescribed today were discussed, and patient expressed understanding.   Education regarding symptom management and diagnosis given to patient on AVS.   Continue to follow with Burnard Hawthorne, FNP for routine health maintenance.   Alden Server and I agreed with plan.   Mable Paris, FNP

## 2021-10-16 NOTE — Assessment & Plan Note (Addendum)
Wells criteria low risk DVT.Advised ultrasound and she politely declines stat US left leg to rule out DVT. She prefers to monitor conservatively with low sodium diet, elevation. She will let me know if worsens or she develops heat, calf pain, erythema

## 2021-10-16 NOTE — Patient Instructions (Signed)
Start phentermine.  Please pay very close attention to increased anxiety, heart palpitations, certainly chest pain.  These would all be reasons to  stop immediately and let me know.  Please schedule your mammogram for February of next year  Please drop off labs from [place] so I can review.   Health Maintenance for Postmenopausal Women Menopause is a normal process in which your ability to get pregnant comes to an end. This process happens slowly over many months or years, usually between the ages of 64 and 62. Menopause is complete when you have missed your menstrual period for 12 months. It is important to talk with your health care provider about some of the most common conditions that affect women after menopause (postmenopausal women). These include heart disease, cancer, and bone loss (osteoporosis). Adopting a healthy lifestyle and getting preventive care can help to promote your health and wellness. The actions you take can also lower your chances of developing some of these common conditions. What are the signs and symptoms of menopause? During menopause, you may have the following symptoms: Hot flashes. These can be moderate or severe. Night sweats. Decrease in sex drive. Mood swings. Headaches. Tiredness (fatigue). Irritability. Memory problems. Problems falling asleep or staying asleep. Talk with your health care provider about treatment options for your symptoms. Do I need hormone replacement therapy? Hormone replacement therapy is effective in treating symptoms that are caused by menopause, such as hot flashes and night sweats. Hormone replacement carries certain risks, especially as you become older. If you are thinking about using estrogen or estrogen with progestin, discuss the benefits and risks with your health care provider. How can I reduce my risk for heart disease and stroke? The risk of heart disease, heart attack, and stroke increases as you age. One of the causes may  be a change in the body's hormones during menopause. This can affect how your body uses dietary fats, triglycerides, and cholesterol. Heart attack and stroke are medical emergencies. There are many things that you can do to help prevent heart disease and stroke. Watch your blood pressure High blood pressure causes heart disease and increases the risk of stroke. This is more likely to develop in people who have high blood pressure readings or are overweight. Have your blood pressure checked: Every 3-5 years if you are 65-51 years of age. Every year if you are 11 years old or older. Eat a healthy diet  Eat a diet that includes plenty of vegetables, fruits, low-fat dairy products, and lean protein. Do not eat a lot of foods that are high in solid fats, added sugars, or sodium. Get regular exercise Get regular exercise. This is one of the most important things you can do for your health. Most adults should: Try to exercise for at least 150 minutes each week. The exercise should increase your heart rate and make you sweat (moderate-intensity exercise). Try to do strengthening exercises at least twice each week. Do these in addition to the moderate-intensity exercise. Spend less time sitting. Even light physical activity can be beneficial. Other tips Work with your health care provider to achieve or maintain a healthy weight. Do not use any products that contain nicotine or tobacco. These products include cigarettes, chewing tobacco, and vaping devices, such as e-cigarettes. If you need help quitting, ask your health care provider. Know your numbers. Ask your health care provider to check your cholesterol and your blood sugar (glucose). Continue to have your blood tested as directed by your health  care provider. Do I need screening for cancer? Depending on your health history and family history, you may need to have cancer screenings at different stages of your life. This may include screening  for: Breast cancer. Cervical cancer. Lung cancer. Colorectal cancer. What is my risk for osteoporosis? After menopause, you may be at increased risk for osteoporosis. Osteoporosis is a condition in which bone destruction happens more quickly than new bone creation. To help prevent osteoporosis or the bone fractures that can happen because of osteoporosis, you may take the following actions: If you are 44-60 years old, get at least 1,000 mg of calcium and at least 600 international units (IU) of vitamin D per day. If you are older than age 2 but younger than age 12, get at least 1,200 mg of calcium and at least 600 international units (IU) of vitamin D per day. If you are older than age 29, get at least 1,200 mg of calcium and at least 800 international units (IU) of vitamin D per day. Smoking and drinking excessive alcohol increase the risk of osteoporosis. Eat foods that are rich in calcium and vitamin D, and do weight-bearing exercises several times each week as directed by your health care provider. How does menopause affect my mental health? Depression may occur at any age, but it is more common as you become older. Common symptoms of depression include: Feeling depressed. Changes in sleep patterns. Changes in appetite or eating patterns. Feeling an overall lack of motivation or enjoyment of activities that you previously enjoyed. Frequent crying spells. Talk with your health care provider if you think that you are experiencing any of these symptoms. General instructions See your health care provider for regular wellness exams and vaccines. This may include: Scheduling regular health, dental, and eye exams. Getting and maintaining your vaccines. These include: Influenza vaccine. Get this vaccine each year before the flu season begins. Pneumonia vaccine. Shingles vaccine. Tetanus, diphtheria, and pertussis (Tdap) booster vaccine. Your health care provider may also recommend other  immunizations. Tell your health care provider if you have ever been abused or do not feel safe at home. Summary Menopause is a normal process in which your ability to get pregnant comes to an end. This condition causes hot flashes, night sweats, decreased interest in sex, mood swings, headaches, or lack of sleep. Treatment for this condition may include hormone replacement therapy. Take actions to keep yourself healthy, including exercising regularly, eating a healthy diet, watching your weight, and checking your blood pressure and blood sugar levels. Get screened for cancer and depression. Make sure that you are up to date with all your vaccines. This information is not intended to replace advice given to you by your health care provider. Make sure you discuss any questions you have with your health care provider. Document Revised: 03/18/2021 Document Reviewed: 03/18/2021 Elsevier Patient Education  [place].

## 2021-10-20 NOTE — Assessment & Plan Note (Signed)
CBE performed and mammogram ordered. Declines screening for cervical cancer as she states she doesn't have a cervix nor pelvic complaints at this time.

## 2021-12-02 ENCOUNTER — Encounter: Payer: Self-pay | Admitting: Family

## 2021-12-11 ENCOUNTER — Other Ambulatory Visit: Payer: Self-pay | Admitting: Family

## 2021-12-11 DIAGNOSIS — K219 Gastro-esophageal reflux disease without esophagitis: Secondary | ICD-10-CM

## 2022-01-13 ENCOUNTER — Encounter: Payer: Self-pay | Admitting: Family

## 2022-01-13 ENCOUNTER — Other Ambulatory Visit: Payer: Self-pay

## 2022-01-13 ENCOUNTER — Ambulatory Visit (INDEPENDENT_AMBULATORY_CARE_PROVIDER_SITE_OTHER): Payer: Managed Care, Other (non HMO) | Admitting: Family

## 2022-01-13 VITALS — BP 128/72 | HR 94 | Temp 96.9°F | Ht 64.0 in | Wt 152.6 lb

## 2022-01-13 DIAGNOSIS — L989 Disorder of the skin and subcutaneous tissue, unspecified: Secondary | ICD-10-CM | POA: Diagnosis not present

## 2022-01-13 DIAGNOSIS — E663 Overweight: Secondary | ICD-10-CM | POA: Diagnosis not present

## 2022-01-13 MED ORDER — PHENTERMINE HCL 37.5 MG PO TABS: 37.5000 mg | ORAL_TABLET | Freq: Every day | ORAL | 2 refills | Status: DC

## 2022-01-13 NOTE — Patient Instructions (Signed)
Always a pleasure seeing you and congratulations on your weight loss.  We will continue phentermine for another 3 months and discuss May our weaning off plan ? ?Please ensure that you do asked dermatology to look at your right palm to ensure they do not think a biopsy is necessary ?

## 2022-01-13 NOTE — Progress Notes (Signed)
Has small spot in center of her right hand, doesn't know where it came from. Would like you to check her ears for wax build up also. ?

## 2022-01-13 NOTE — Progress Notes (Signed)
? ?Subjective:  ? ? Patient ID: Mikayla Wagner, female    DOB: 1961/08/14, 61 y.o.   MRN: [id number] ? ?CC: Mikayla Wagner is a 61 y.o. female who presents today for follow up.  ? ?HPI: Feels well today.  She has noticed in her right palm a small skin lesion.  No  triggering of the finger.  The area is nontender.  No swelling redness.  She thinks lesion is actually smaller in size.  She does note that she is at the keyboard most of the day ? ?Started phentermine 4 months ago.  She has lost 12 pounds.  Denies palpitations, increased anxiety or insomnia.  She is tolerating medication well.  She would like to continue for another 3 months ? ?HISTORY:  ?Past Medical History:  ?Diagnosis Date  ? Abnormal mammogram   ? 2013, plan to repeat mammogram 05/2013  ? Ovarian cyst, complex 2010  ? s/p removal  ? ?Past Surgical History:  ?Procedure Laterality Date  ? ABDOMINAL HYSTERECTOMY    ? Cysts in uterus and outside of uterus. NO UTERINE CANCER; no cervix and no ovaries. Declines pap smear.  ? APPENDECTOMY    ? AUGMENTATION MAMMAPLASTY Bilateral 2006  ? saline  ? BREAST ENHANCEMENT SURGERY  2006  ? CESAREAN SECTION    ? 4x  ? COLON SURGERY    ? COLONOSCOPY WITH PROPOFOL N/A 12/10/2016  ? Procedure: COLONOSCOPY WITH PROPOFOL;  Surgeon: Robert Bellow, MD;  Location: [facility] ENDOSCOPY;  Service: Endoscopy;  Laterality: N/A;  ? [place] OF UTERUS  2005  ? PARTIAL COLECTOMY  2005  ? ruptured diverticuli, s/p colostomy and reversal  ? SCAR REVISION  2011  ? ?Family History  ?Problem Relation Age of Onset  ? Cancer Paternal Aunt   ?     ovarian cancer  ? ? ?Allergies: Codeine, Erythromycin, Morphine sulfate, and Latex ?Current Outpatient Medications on File Prior to Visit  ?Medication Sig Dispense Refill  ? famotidine (PEPCID) 20 MG tablet TAKE 1 TABLET(20 MG) BY MOUTH TWICE DAILY AS NEEDED FOR HEARTBURN OR INDIGESTION 180 tablet 1  ? nitrofurantoin, macrocrystal-monohydrate, (MACROBID) 100 MG capsule Take 1 capsule  (100 mg total) by mouth daily as needed. Take after intercourse. Take with food. (Patient not taking: Reported on 10/16/2021) 30 capsule 1  ? ?Current Facility-Administered Medications on File Prior to Visit  ?Medication Dose Route Frequency Provider Last Rate Last Admin  ? cyanocobalamin ((VITAMIN B-12)) injection 1,000 mcg  1,000 mcg Intramuscular Once Jackolyn Confer, MD      ? ? ?Social History  ? ?Tobacco Use  ? Smoking status: Never  ? Smokeless tobacco: Never  ?Substance Use Topics  ? Alcohol use: Yes  ?  Comment: socially  ? Drug use: No  ? ? ?Review of Systems  ?Constitutional:  Negative for chills and fever.  ?Respiratory:  Negative for cough.   ?Cardiovascular:  Negative for chest pain and palpitations.  ?Gastrointestinal:  Negative for nausea and vomiting.  ?Skin:  Negative for rash and wound.  ?   ?Objective:  ?  ?BP 128/72 (BP Location: Left Arm, Patient Position: Sitting, Cuff Size: Normal)   Pulse 94   Temp (!) 96.9 ?F (36.1 ?C) (Temporal)   Ht '5\' 4"'$  (1.626 m)   Wt 152 lb 9.6 oz (69.2 kg)   SpO2 99%   BMI 26.19 kg/m?  ?BP Readings from Last 3 Encounters:  ?01/13/22 128/72  ?10/16/21 112/68  ?01/07/21 120/82  ? ?Wt Readings  from Last 3 Encounters:  ?01/13/22 152 lb 9.6 oz (69.2 kg)  ?10/16/21 164 lb 6.4 oz (74.6 kg)  ?01/07/21 156 lb 3.2 oz (70.9 kg)  ? ? ?Physical Exam ?Vitals reviewed.  ?Constitutional:   ?   Appearance: She is well-developed.  ?Eyes:  ?   Conjunctiva/sclera: Conjunctivae normal.  ?Cardiovascular:  ?   Rate and Rhythm: Normal rate and regular rhythm.  ?   Pulses: Normal pulses.  ?   Heart sounds: Normal heart sounds.  ?Pulmonary:  ?   Effort: Pulmonary effort is normal.  ?   Breath sounds: Normal breath sounds. No wheezing, rhonchi or rales.  ?Skin: ?   General: Skin is warm and dry.  ? ?    ?   Comments: Approx 2-3 cm oblong right callus appearing area proximal to fourth digit right palm.  Nontender.  No triggering of finger.  ?Neurological:  ?   Mental Status: She is  alert.  ?Psychiatric:     ?   Speech: Speech normal.     ?   Behavior: Behavior normal.     ?   Thought Content: Thought content normal.  ? ? ?   ?Assessment & Plan:  ? ?Problem List Items Addressed This Visit   ? ?  ? Musculoskeletal and Integument  ? Skin lesion  ?  Area and presentation most consistent with callus area from typing.  We did discuss whether this could be an early sign of Dupuytren's contracture and thickening of the nerve sheath.  It is not tender and  she suspects smaller in size.  Advised her her at next follow-up with dermatology to ask if  warrants any further investigation.  She will let me know if changes in size or becomes uncomfortable ?  ?  ?  ? Other  ? Overweight (BMI 25.0-29.9) - Primary  ?  Patient has lost approximately 12 pounds on phentermine 37.5 mg.  She prefers tablet as she was on in the past.  She request 75-monthrefill today.  In the absence of any side effects from medication, I will refill for 3 months.  At that point we will discuss a weaning plan.  We discussed briefly GLP-1 agonist, such as wegovy, for maintenance. ?I looked up patient on [place] Controlled Substances Reporting System PMP AWARE and saw no activity that raised concern of inappropriate use.  ? ?  ?  ? Relevant Medications  ? phentermine (ADIPEX-P) 37.5 MG tablet  ? ? ? ?I have discontinued TAudrie Gallus Wagner's phentermine. I am also having her start on phentermine. Additionally, I am having her maintain her nitrofurantoin (macrocrystal-monohydrate) and famotidine. We will continue to administer cyanocobalamin. ? ? ?Meds ordered this encounter  ?Medications  ? phentermine (ADIPEX-P) 37.5 MG tablet  ?  Sig: Take 1 tablet (37.5 mg total) by mouth daily before breakfast.  ?  Dispense:  30 tablet  ?  Refill:  2  ?  Order Specific Question:   Supervising Provider  ?  Answer:   TCrecencio Mc[[id number]]  ? ? ?Return precautions given.  ? ?Risks, benefits, and alternatives of the medications and treatment plan prescribed  today were discussed, and patient expressed understanding.  ? ?Education regarding symptom management and diagnosis given to patient on AVS. ? ?Continue to follow with ABurnard Hawthorne FNP for routine health maintenance.  ? ?TAlden Serverand I agreed with plan.  ? ?MMable Paris FNP ? ? ? ?

## 2022-01-13 NOTE — Assessment & Plan Note (Signed)
Area and presentation most consistent with callus area from typing.  We did discuss whether this could be an early sign of Dupuytren's contracture and thickening of the nerve sheath.  It is not tender and  she suspects smaller in size.  Advised her her at next follow-up with dermatology to ask if  warrants any further investigation.  She will let me know if changes in size or becomes uncomfortable ?

## 2022-01-13 NOTE — Assessment & Plan Note (Signed)
Patient has lost approximately 12 pounds on phentermine 37.5 mg.  She prefers tablet as she was on in the past.  She request 50-monthrefill today.  In the absence of any side effects from medication, I will refill for 3 months.  At that point we will discuss a weaning plan.  We discussed briefly GLP-1 agonist, such as wegovy, for maintenance. ?I looked up patient on [place] Controlled Substances Reporting System PMP AWARE and saw no activity that raised concern of inappropriate use.  ? ?

## 2022-01-15 ENCOUNTER — Ambulatory Visit: Payer: Managed Care, Other (non HMO) | Admitting: Family

## 2022-01-20 ENCOUNTER — Ambulatory Visit
Admission: RE | Admit: 2022-01-20 | Discharge: 2022-01-20 | Disposition: A | Discharge status: Home / Self Care | Payer: Managed Care, Other (non HMO) | Source: Ambulatory Visit | Attending: Family | Admitting: Family

## 2022-01-20 ENCOUNTER — Other Ambulatory Visit: Payer: Self-pay

## 2022-01-20 DIAGNOSIS — Z1231 Encounter for screening mammogram for malignant neoplasm of breast: Secondary | ICD-10-CM | POA: Insufficient documentation

## 2022-01-20 DIAGNOSIS — Z0000 Encounter for general adult medical examination without abnormal findings: Secondary | ICD-10-CM | POA: Insufficient documentation

## 2022-04-09 ENCOUNTER — Encounter: Payer: Self-pay | Admitting: Family

## 2022-04-09 ENCOUNTER — Ambulatory Visit (INDEPENDENT_AMBULATORY_CARE_PROVIDER_SITE_OTHER): Payer: Managed Care, Other (non HMO) | Admitting: Family

## 2022-04-09 VITALS — BP 126/78 | HR 91 | Temp 97.4°F | Ht 62.0 in | Wt 149.8 lb

## 2022-04-09 DIAGNOSIS — E663 Overweight: Secondary | ICD-10-CM | POA: Diagnosis not present

## 2022-04-09 MED ORDER — PHENTERMINE HCL 37.5 MG PO TABS
37.5000 mg | ORAL_TABLET | Freq: Every day | ORAL | 0 refills | Status: DC
Start: 2022-04-09 — End: 2022-04-22

## 2022-04-09 NOTE — Progress Notes (Signed)
Subjective:    Patient ID: Mikayla Wagner, female    DOB: 10-08-1961, 61 y.o.   MRN: [id number]  CC: Mikayla Wagner is a 61 y.o. female who presents today for follow up.   HPI: Feels well today No new complaints   She remains compliant with phentermine 37.5 mg. She continues to loose weight and pleased with medication.  She would like 1 more month of phentermine prior to transitioning to GLP-1.   denies cp, palpitations.  No known  history of cardiovascular disease She continues to walk daily.   No personal or family history of thyroid cancer, multiple endocrine neoplasia. H/o prediabetes     HISTORY:  Past Medical History:  Diagnosis Date   Abnormal mammogram    2013, plan to repeat mammogram 05/2013   Ovarian cyst, complex 2010   s/p removal   Past Surgical History:  Procedure Laterality Date   ABDOMINAL HYSTERECTOMY     Cysts in uterus and outside of uterus. NO UTERINE CANCER; no cervix and no ovaries. Declines pap smear.   APPENDECTOMY     AUGMENTATION MAMMAPLASTY Bilateral 2006   saline   BREAST ENHANCEMENT SURGERY  2006   CESAREAN SECTION     4x   COLON SURGERY     COLONOSCOPY WITH PROPOFOL N/A 12/10/2016   Procedure: COLONOSCOPY WITH PROPOFOL;  Surgeon: Robert Bellow, MD;  Location: [facility] ENDOSCOPY;  Service: Endoscopy;  Laterality: N/A;   DIL[place] AND CURETT[place] OF UTERUS  2005   PARTIAL COLECTOMY  2005   ruptured diverticuli, s/p colostomy and reversal   SCAR REVISION  2011   Family History  Problem Relation Age of Onset   Thyroid disease Sister    Cancer Paternal Aunt        ovarian cancer   Breast cancer Neg Hx    Diabetes Neg Hx    Thyroid cancer Neg Hx     Allergies: Codeine, Erythromycin, Morphine sulfate, and Latex Current Outpatient Medications on File Prior to Visit  Medication Sig Dispense Refill   famotidine (PEPCID) 20 MG tablet TAKE 1 TABLET(20 MG) BY MOUTH TWICE DAILY AS NEEDED FOR HEARTBURN OR INDIGESTION 180 tablet 1   Current  Facility-Administered Medications on File Prior to Visit  Medication Dose Route Frequency Provider Last Rate Last Admin   cyanocobalamin ((VITAMIN B-12)) injection 1,000 mcg  1,000 mcg Intramuscular Once Mikayla Confer, MD        Social History   Tobacco Use   Smoking status: Never   Smokeless tobacco: Never  Substance Use Topics   Alcohol use: Yes    Comment: socially   Drug use: No    Review of Systems  Constitutional:  Negative for chills and fever.  HENT:  Negative for trouble swallowing.   Respiratory:  Negative for cough.   Cardiovascular:  Negative for chest pain and palpitations.  Gastrointestinal:  Negative for nausea and vomiting.     Objective:    BP 126/78 (BP Location: Left Arm, Patient Position: Sitting, Cuff Size: Normal)   Pulse 91   Temp (!) 97.4 F (36.3 C) (Oral)   Ht '5\' 2"'$  (1.575 m)   Wt 149 lb 12.8 oz (67.9 kg)   SpO2 99%   BMI 27.40 kg/m  BP Readings from Last 3 Encounters:  04/09/22 126/78  01/13/22 128/72  10/16/21 112/68   Wt Readings from Last 3 Encounters:  04/09/22 149 lb 12.8 oz (67.9 kg)  01/13/22 152 lb 9.6 oz (69.2 kg)  10/16/21 164  lb 6.4 oz (74.6 kg)    Physical Exam Vitals reviewed.  Constitutional:      Appearance: She is well-developed.  Eyes:     Conjunctiva/sclera: Conjunctivae normal.  Neck:     Thyroid: No thyroid mass, thyromegaly or thyroid tenderness.  Cardiovascular:     Rate and Rhythm: Normal rate and regular rhythm.     Pulses: Normal pulses.     Heart sounds: Normal heart sounds.  Pulmonary:     Effort: Pulmonary effort is normal.     Breath sounds: Normal breath sounds. No wheezing, rhonchi or rales.  Skin:    General: Skin is warm and dry.  Neurological:     Mental Status: She is alert.  Psychiatric:        Speech: Speech normal.        Behavior: Behavior normal.        Thought Content: Thought content normal.       Assessment & Plan:   Problem List Items Addressed This Visit       Other    Overweight (BMI 25.0-29.9) - Primary    Congratulated patient on weight loss.  She denies any cardiovascular side effects and no known history of cardiovascular disease.  She understands cardiovascular complications of phentermine.   she would like 1 more month of phentermine and I agreed we could do 1 more month and then will discontinue medication altogether.After which transition to [facility]. Counseled on blackbox warning as relates to thyroid cancer,MEN.  Counseled on administration and side effects of Wegovy.  She will let me know how she is doing .I looked up patient on [place] Controlled Substances Reporting System PMP AWARE and saw no activity that raised concern of inappropriate use.         Relevant Medications   phentermine (ADIPEX-P) 37.5 MG tablet     I have discontinued Audrie Gallus. Notz's nitrofurantoin (macrocrystal-monohydrate). I am also having her maintain her famotidine and phentermine. We will continue to administer cyanocobalamin.   Meds ordered this encounter  Medications   phentermine (ADIPEX-P) 37.5 MG tablet    Sig: Take 1 tablet (37.5 mg total) by mouth daily before breakfast.    Dispense:  30 tablet    Refill:  0    Order Specific Question:   Supervising Provider    Answer:   Crecencio Mc [[id number]]    Return precautions given.   Risks, benefits, and alternatives of the medications and treatment plan prescribed today were discussed, and patient expressed understanding.   Education regarding symptom management and diagnosis given to patient on AVS.  Continue to follow with Burnard Hawthorne, FNP for routine health maintenance.   Alden Server and I agreed with plan.   Mable Paris, FNP

## 2022-04-09 NOTE — Assessment & Plan Note (Addendum)
Congratulated patient on weight loss.  She denies any cardiovascular side effects and no known history of cardiovascular disease.  She understands cardiovascular complications of phentermine.   she would like 1 more month of phentermine and I agreed we could do 1 more month and then will discontinue medication altogether.After which transition to [facility]. Counseled on blackbox warning as relates to thyroid cancer,MEN.  Counseled on administration and side effects of Wegovy.  She will let me know how she is doing .I looked up patient on [place] Controlled Substances Reporting System PMP AWARE and saw no activity that raised concern of inappropriate use.

## 2022-04-16 ENCOUNTER — Encounter: Payer: Self-pay | Admitting: Family

## 2022-04-18 ENCOUNTER — Other Ambulatory Visit: Payer: Self-pay | Admitting: Family

## 2022-04-18 DIAGNOSIS — E663 Overweight: Secondary | ICD-10-CM

## 2022-04-18 MED ORDER — WEGOVY 0.25 MG/0.5ML ~~LOC~~ SOAJ: 0.2500 mg | SUBCUTANEOUS | 2 refills | Status: DC

## 2022-04-22 ENCOUNTER — Other Ambulatory Visit: Payer: Self-pay | Admitting: Family

## 2022-04-22 DIAGNOSIS — E663 Overweight: Secondary | ICD-10-CM

## 2022-04-22 MED ORDER — TIRZEPATIDE 2.5 MG/0.5ML ~~LOC~~ SOAJ: 2.5000 mg | SUBCUTANEOUS | 1 refills | Status: DC

## 2022-04-23 ENCOUNTER — Ambulatory Visit: Payer: Managed Care, Other (non HMO) | Admitting: Dermatology

## 2022-04-28 NOTE — Telephone Encounter (Signed)
Patient called to state that she has been in contact with Cheri Rous, CMA, via MyChart and would like to speak with her on the phone.  Please call.

## 2022-04-29 ENCOUNTER — Other Ambulatory Visit: Payer: Self-pay

## 2022-04-29 DIAGNOSIS — E663 Overweight: Secondary | ICD-10-CM

## 2022-04-29 MED ORDER — TIRZEPATIDE 2.5 MG/0.5ML ~~LOC~~ SOAJ: 2.5000 mg | SUBCUTANEOUS | 1 refills | Status: DC

## 2022-04-30 ENCOUNTER — Encounter: Payer: Self-pay | Admitting: Dermatology

## 2022-04-30 ENCOUNTER — Ambulatory Visit (INDEPENDENT_AMBULATORY_CARE_PROVIDER_SITE_OTHER): Payer: Managed Care, Other (non HMO) | Admitting: Dermatology

## 2022-04-30 DIAGNOSIS — R238 Other skin changes: Secondary | ICD-10-CM | POA: Diagnosis not present

## 2022-04-30 DIAGNOSIS — L304 Erythema intertrigo: Secondary | ICD-10-CM | POA: Diagnosis not present

## 2022-04-30 DIAGNOSIS — L570 Actinic keratosis: Secondary | ICD-10-CM | POA: Diagnosis not present

## 2022-04-30 DIAGNOSIS — Z1283 Encounter for screening for malignant neoplasm of skin: Secondary | ICD-10-CM

## 2022-04-30 NOTE — Patient Instructions (Signed)
Gluteal fold triple paste AF

## 2022-05-06 ENCOUNTER — Encounter: Payer: Self-pay | Admitting: Family

## 2022-05-21 ENCOUNTER — Encounter: Payer: Self-pay | Admitting: Family

## 2022-05-30 ENCOUNTER — Encounter: Payer: Self-pay | Admitting: Dermatology

## 2022-05-30 NOTE — Progress Notes (Signed)
   Follow-Up Visit   Subjective  Mikayla Wagner is a 61 y.o. female who presents for the following: Annual Exam (Skin check, no personal history of atypia or skin cancer).  General skin examination, several areas to check Location:  Duration:  Quality:  Associated Signs/Symptoms: Modifying Factors:  Severity:  Timing: Context:   Objective  Well appearing patient in no apparent distress; mood and affect are within normal limits. No atypical nevi or signs of NMSC noted at the time of the visit. Left thigh purple angioma, typical dermoscopy gluteal fold triple paste AF  Mid Lower [place] Lip Soft Blue 4 mm dermal papule  Mid Forehead Subtle 2 mm pink scale, historically stable  Gluteal Crease Moderate glazed erythema without pustules or satellite lesions.  Irritant intertrigo.    A full examination was performed including scalp, head, eyes, ears, nose, lips, neck, chest, axillae, abdomen, back, buttocks, bilateral upper extremities, bilateral lower extremities, hands, feet, fingers, toes, fingernails, and toenails. All findings within normal limits unless otherwise noted below.  Patient beneath undergarment not fully examined   Assessment & Plan    Screening exam for skin cancer  Keep yearly skin exams  Gluteal fold triple paste AF  Venous [place] Mid [place] Lip  No intervention currently necessary  AK (actinic keratosis) Mid Forehead  Recheck if there is clinical change.  Erythema intertrigo Gluteal Crease  Triple paste AF applied daily after bathing.  Option of sits baths.      I, Lavonna Monarch, MD, have reviewed all documentation for this visit.  The documentation on 05/30/22 for the exam, diagnosis, procedures, and orders are all accurate and complete.

## 2022-06-09 ENCOUNTER — Encounter: Payer: Self-pay | Admitting: Dermatology

## 2022-06-13 ENCOUNTER — Emergency Department: Payer: Managed Care, Other (non HMO)

## 2022-06-13 ENCOUNTER — Emergency Department
Admission: EM | Admit: 2022-06-13 | Discharge: 2022-06-13 | Disposition: A | Discharge status: Home / Self Care | Payer: Managed Care, Other (non HMO) | Attending: Emergency Medicine | Admitting: Emergency Medicine

## 2022-06-13 ENCOUNTER — Other Ambulatory Visit: Payer: Self-pay

## 2022-06-13 ENCOUNTER — Encounter: Payer: Self-pay | Admitting: Emergency Medicine

## 2022-06-13 DIAGNOSIS — N132 Hydronephrosis with renal and ureteral calculous obstruction: Secondary | ICD-10-CM | POA: Insufficient documentation

## 2022-06-13 DIAGNOSIS — R1032 Left lower quadrant pain: Secondary | ICD-10-CM | POA: Diagnosis present

## 2022-06-13 DIAGNOSIS — N201 Calculus of ureter: Secondary | ICD-10-CM

## 2022-06-13 LAB — COMPREHENSIVE METABOLIC PANEL
ALT: 12 U/L (ref 0–44)
AST: 20 U/L (ref 15–41)
Albumin: 4.5 g/dL (ref 3.5–5.0)
Alkaline Phosphatase: 66 U/L (ref 38–126)
Anion gap: 11 (ref 5–15)
BUN: 15 mg/dL (ref 8–23)
CO2: 24 mmol/L (ref 22–32)
Calcium: 9.1 mg/dL (ref 8.9–10.3)
Chloride: 103 mmol/L (ref 98–111)
Creatinine, Ser: 0.73 mg/dL (ref 0.44–1.00)
GFR, Estimated: >60 mL/min (ref >60)
Glucose, Bld: 116 mg/dL — ABNORMAL HIGH (ref 70–99)
Potassium: 3.8 mmol/L (ref 3.5–5.1)
Sodium: 138 mmol/L (ref 135–145)
Total Bilirubin: 0.7 mg/dL (ref 0.3–1.2)
Total Protein: 7.5 g/dL (ref 6.5–8.1)

## 2022-06-13 LAB — CBC
HCT: 45.2 % (ref 36.0–46.0)
Hemoglobin: 14.5 g/dL (ref 12.0–15.0)
MCH: 27 pg (ref 26.0–34.0)
MCHC: 32.1 g/dL (ref 30.0–36.0)
MCV: 84 fL (ref 80.0–100.0)
Platelets: 288 10*3/uL (ref 150–400)
RBC: 5.38 MIL/uL — ABNORMAL HIGH (ref 3.87–5.11)
RDW: 13.1 % (ref 11.5–15.5)
WBC: 8.7 10*3/uL (ref 4.0–10.5)
nRBC: 0 % (ref 0.0–0.2)

## 2022-06-13 LAB — URINALYSIS, ROUTINE W REFLEX MICROSCOPIC
Bilirubin Urine: NEGATIVE
Glucose, UA: NEGATIVE mg/dL
Ketones, ur: 20 mg/dL — AB
Nitrite: NEGATIVE
Protein, ur: 30 mg/dL — AB
RBC / HPF: >50 RBC/hpf — ABNORMAL HIGH (ref 0–5)
Specific Gravity, Urine: 1.025 (ref 1.005–1.030)
pH: 5 (ref 5.0–8.0)

## 2022-06-13 LAB — LIPASE, BLOOD: Lipase: 31 U/L (ref 11–51)

## 2022-06-13 MED ORDER — METOCLOPRAMIDE HCL 10 MG PO TABS: 10.0000 mg | ORAL_TABLET | Freq: Three times a day (TID) | ORAL | 0 refills | Status: DC

## 2022-06-13 MED ORDER — SODIUM CHLORIDE 0.9 % IV BOLUS
500.0000 mL | Freq: Once | INTRAVENOUS | Status: AC
  Administered 2022-06-13: 500 mL via INTRAVENOUS

## 2022-06-13 MED ORDER — METOCLOPRAMIDE HCL 5 MG/ML IJ SOLN
10.0000 mg | Freq: Once | INTRAMUSCULAR | Status: AC
  Administered 2022-06-13: 10 mg via INTRAVENOUS
  Filled 2022-06-13: qty 2

## 2022-06-13 MED ORDER — CYCLOBENZAPRINE HCL 5 MG PO TABS: 5.0000 mg | ORAL_TABLET | Freq: Three times a day (TID) | ORAL | 0 refills | Status: DC | PRN

## 2022-06-13 MED ORDER — TAMSULOSIN HCL 0.4 MG PO CAPS
0.4000 mg | ORAL_CAPSULE | Freq: Every day | ORAL | 0 refills | Status: AC
Start: 2022-06-13 — End: 2022-06-23

## 2022-06-13 MED ORDER — FENTANYL CITRATE PF 50 MCG/ML IJ SOSY
50.0000 ug | PREFILLED_SYRINGE | Freq: Once | INTRAMUSCULAR | Status: AC
  Administered 2022-06-13: 50 ug via INTRAVENOUS
  Filled 2022-06-13: qty 1

## 2022-06-13 MED ORDER — OXYCODONE-ACETAMINOPHEN 5-325 MG PO TABS
1.0000 | ORAL_TABLET | Freq: Once | ORAL | Status: AC
  Administered 2022-06-13: 1 via ORAL
  Filled 2022-06-13: qty 1

## 2022-06-13 MED ORDER — ONDANSETRON 4 MG PO TBDP
4.0000 mg | ORAL_TABLET | Freq: Once | ORAL | Status: AC
  Administered 2022-06-13: 4 mg via ORAL
  Filled 2022-06-13: qty 1

## 2022-06-13 MED ORDER — OXYCODONE-ACETAMINOPHEN 5-325 MG PO TABS: 1.0000 | ORAL_TABLET | Freq: Four times a day (QID) | ORAL | 0 refills | Status: AC | PRN

## 2022-06-13 MED ORDER — KETOROLAC TROMETHAMINE 30 MG/ML IJ SOLN
30.0000 mg | Freq: Once | INTRAMUSCULAR | Status: AC
  Administered 2022-06-13: 30 mg via INTRAVENOUS
  Filled 2022-06-13: qty 1

## 2022-06-13 MED ORDER — TAMSULOSIN HCL 0.4 MG PO CAPS
0.4000 mg | ORAL_CAPSULE | Freq: Once | ORAL | Status: AC
  Administered 2022-06-13: 0.4 mg via ORAL
  Filled 2022-06-13: qty 1

## 2022-06-13 NOTE — ED Provider Triage Note (Signed)
Emergency Medicine Provider Triage Evaluation Note  Mikayla Wagner , a 61 y.o. female  was evaluated in triage.  Pt complains of flank pain.  Review of Systems  Positive: Flank pain Negative: Fever  Physical Exam  BP (!) 166/80 (BP Location: Left Arm)   Pulse 88   Temp 97.9 F (36.6 C) (Oral)   Resp 16   Ht '5\' 2"'$  (1.575 m)   Wt 64.9 kg   SpO2 98%   BMI 26.16 kg/m  Gen:   Awake, no distress   Resp:  Normal effort  MSK:   Moves extremities without difficulty  Other:    Medical Decision Making  Medically screening exam initiated at 4:10 PM.  Appropriate orders placed.  Mikayla Wagner was informed that the remainder of the evaluation will be completed by another provider, this initial triage assessment does not replace that evaluation, and the importance of remaining in the ED until their evaluation is complete.  Patient's labs returned with a UA with blood.  Concerns for kidney stone.  Patient was given Percocet 5/325 and Zofran 4 mg ODT in triage   Versie Starks, PA-C 06/13/22 1611

## 2022-06-13 NOTE — ED Provider Notes (Signed)
[facility] Emergency Department Provider Note     Event Date/Time   First MD Initiated Contact with Patient 06/13/22 1701     (approximate)   History   Flank Pain   HPI  Mikayla Wagner is a 61 y.o. female presents to the ED with left flank pain with onset at about noon today.  Patient presents with pain initiated in her left lower pelvic region.  She reports 1 episode of emesis prior to arrival.  She denies any history of kidney stones, diverticulitis, or ovarian cyst.  Denies any dysuria, hematuria, urinary retention.     Physical Exam   Triage Vital Signs: ED Triage Vitals  Enc Vitals Group     BP 06/13/22 1510 (!) 166/80     Pulse Rate 06/13/22 1510 88     Resp 06/13/22 1510 16     Temp 06/13/22 1510 97.9 F (36.6 C)     Temp Source 06/13/22 1510 Oral     SpO2 06/13/22 1510 98 %     Weight 06/13/22 1511 143 lb (64.9 kg)     Height 06/13/22 1511 '5\' 2"'$  (1.575 m)     Head Circumference --      Peak Flow --      Pain Score 06/13/22 1511 7     Pain Loc --      Pain Edu? --      Excl. in [place]? --     Most recent vital signs: Vitals:   06/13/22 1832 06/13/22 2006  BP: 111/72 132/73  Pulse: 96 (!) 110  Resp: 16 17  Temp:  98.3 F (36.8 C)  SpO2: 96% 95%    General Awake, no distress.  CV:  Good peripheral perfusion.  RESP:  Normal effort.  ABD:  No distention.  Nontender.  Tender palpation of the left flank.   ED Results / Procedures / Treatments   Labs (all labs ordered are listed, but only abnormal results are displayed) Labs Reviewed  COMPREHENSIVE METABOLIC PANEL - Abnormal; Notable for the following components:      Result Value   Glucose, Bld 116 (*)    All other components within normal limits  CBC - Abnormal; Notable for the following components:   RBC 5.38 (*)    All other components within normal limits  URINALYSIS, ROUTINE W REFLEX MICROSCOPIC - Abnormal; Notable for the following components:   Color, Urine  YELLOW (*)    APPearance HAZY (*)    Hgb urine dipstick LARGE (*)    Ketones, ur 20 (*)    Protein, ur 30 (*)    Leukocytes,Ua TRACE (*)    RBC / HPF >50 (*)    Bacteria, UA FEW (*)    All other components within normal limits  LIPASE, BLOOD     EKG   RADIOLOGY  I personally viewed and evaluated these images as part of my medical decision making, as well as reviewing the written report by the radiologist.  ED Provider Interpretation: left ureterocalculi as noted}  CT Renal Stone Study  Result Date: 06/13/2022 CLINICAL DATA:  Onset of LEFT flank pain today at noon EXAM: CT ABDOMEN AND PELVIS WITHOUT CONTRAST TECHNIQUE: Multidetector CT imaging of the abdomen and pelvis was performed following the standard protocol without IV contrast. RADIATION DOSE REDUCTION: This exam was performed according to the departmental dose-optimization program which includes automated exposure control, adjustment of the mA and/or kV according to patient size and/or use of iterative reconstruction  technique. COMPARISON:  None Available. FINDINGS: Lower chest: Lung bases clear.  BILATERAL breast prostheses noted. Hepatobiliary: Gallbladder unremarkable. 13 x 8 mm low-attenuation focus superiorly lateral segment LEFT lobe liver consistent with cyst. Remainder of liver unremarkable. Pancreas: Normal appearance Spleen: Normal appearance Adrenals/Urinary Tract: Adrenal glands and RIGHT kidney normal appearance. Probable peripelvic cysts LEFT kidney. Mild LEFT hydronephrosis and proximal hydroureter secondary to a 1-2 mm calculus at the LEFT ureterovesical junction. Bladder decompressed. RIGHT ureter unremarkable. Stomach/Bowel: Scattered colonic diverticulosis. Low lying cecum in pelvis. Normal appendix. Small hiatal hernia. Remaining bowel loops unremarkable. Vascular/Lymphatic: Aorta normal caliber.  No adenopathy. Reproductive: Uterus surgically absent. Nonvisualization of ovaries. Other: No free air or free fluid. No  hernia or an inflammatory process. Musculoskeletal: Unremarkable. IMPRESSION: Mild LEFT hydronephrosis and hydroureter secondary to a 1-2 mm LEFT ureterovesical junction calculus. Probable peripelvic cysts LEFT kidney. Small hiatal hernia. Colonic diverticulosis without evidence of diverticulitis. Electronically Signed   By: Lavonia Dana M.D.   On: 06/13/2022 16:30     PROCEDURES:  Critical Care performed: No  Procedures   MEDICATIONS ORDERED IN ED: Medications  oxyCODONE-acetaminophen (PERCOCET/ROXICET) 5-325 MG per tablet 1 tablet (1 tablet Oral Given 06/13/22 1611)  ondansetron (ZOFRAN-ODT) disintegrating tablet 4 mg (4 mg Oral Given 06/13/22 1611)  sodium chloride 0.9 % bolus 500 mL (0 mLs Intravenous Stopped 06/13/22 1844)  ketorolac (TORADOL) 30 MG/ML injection 30 mg (30 mg Intravenous Given 06/13/22 1728)  metoCLOPramide (REGLAN) injection 10 mg (10 mg Intravenous Given 06/13/22 1730)  tamsulosin (FLOMAX) capsule 0.4 mg (0.4 mg Oral Given 06/13/22 1731)  fentaNYL (SUBLIMAZE) injection 50 mcg (50 mcg Intravenous Given 06/13/22 1842)  sodium chloride 0.9 % bolus 500 mL (0 mLs Intravenous Stopped 06/13/22 2003)     IMPRESSION / MDM / ASSESSMENT AND PLAN / ED COURSE  I reviewed the triage vital signs and the nursing notes.                              Differential diagnosis includes, but is not limited to, hydronephrosis, infected kidney stone, nephrolithiasis, UTI, pyelonephritis  Patient's presentation is most consistent with acute complicated illness / injury requiring diagnostic workup.  Patient's diagnosis is consistent with ureterolithiasis on the left. Patient will be discharged home with prescriptions for Percocet, cyclobenzaprine, Reglan, and tamsulosin. Patient is to follow up with urology as needed or otherwise directed. Patient is given ED precautions to return to the ED for any worsening or new symptoms.     FINAL CLINICAL IMPRESSION(S) / ED DIAGNOSES   Final diagnoses:   Ureterolithiasis     Rx / DC Orders   ED Discharge Orders          Ordered    metoCLOPramide (REGLAN) 10 MG tablet  3 times daily with meals        06/13/22 1935    oxyCODONE-acetaminophen (PERCOCET) 5-325 MG tablet  Every 6 hours PRN        06/13/22 1935    tamsulosin (FLOMAX) 0.4 MG CAPS capsule  Daily after supper        06/13/22 1935    cyclobenzaprine (FLEXERIL) 5 MG tablet  3 times daily PRN        06/13/22 1935             Note:  This document was prepared using Dragon voice recognition software and may include unintentional dictation errors.    Melvenia Needles, PA-C 06/14/22 0011  Carrie Mew, MD 06/23/22 1335

## 2022-06-13 NOTE — ED Triage Notes (Signed)
Patient c/o left flank pain onset of noon today. Patient states pain started in her pelvic area. Reports one episode of emesis.

## 2022-06-13 NOTE — Discharge Instructions (Addendum)
Take the prescription meds as directed.  Continue to hydrate and empty your bladder regularly.  You may strain your urine to confirm passage of the small stones.  Follow-up with urology for ongoing symptoms.

## 2022-06-26 ENCOUNTER — Encounter: Payer: Self-pay | Admitting: Family

## 2022-06-30 ENCOUNTER — Other Ambulatory Visit: Payer: Self-pay

## 2022-06-30 ENCOUNTER — Other Ambulatory Visit: Payer: Self-pay | Admitting: Family

## 2022-06-30 DIAGNOSIS — E663 Overweight: Secondary | ICD-10-CM

## 2022-06-30 MED ORDER — TIRZEPATIDE 7.5 MG/0.5ML ~~LOC~~ SOAJ: 7.5000 mg | SUBCUTANEOUS | 3 refills | Status: DC

## 2022-06-30 MED ORDER — TIRZEPATIDE 2.5 MG/0.5ML ~~LOC~~ SOAJ: 2.5000 mg | SUBCUTANEOUS | 1 refills | Status: DC

## 2022-07-02 ENCOUNTER — Telehealth: Payer: Self-pay | Admitting: Family

## 2022-07-02 NOTE — Telephone Encounter (Signed)
Patient dropped off a note about a Prior Auth. She wanted [facility] to get this information. Letter/notes are up front in Arnett's color folder.

## 2022-07-03 ENCOUNTER — Other Ambulatory Visit: Payer: Self-pay

## 2022-07-03 DIAGNOSIS — E663 Overweight: Secondary | ICD-10-CM

## 2022-07-03 MED ORDER — TIRZEPATIDE 7.5 MG/0.5ML ~~LOC~~ SOAJ: 7.5000 mg | SUBCUTANEOUS | 3 refills | Status: DC

## 2022-07-10 NOTE — Telephone Encounter (Signed)
Patient was notified that PA was denied for the [facility]

## 2022-10-07 ENCOUNTER — Encounter: Payer: Self-pay | Admitting: Family

## 2022-10-22 ENCOUNTER — Encounter: Payer: Self-pay | Admitting: Family

## 2022-10-22 ENCOUNTER — Ambulatory Visit (INDEPENDENT_AMBULATORY_CARE_PROVIDER_SITE_OTHER): Payer: Managed Care, Other (non HMO) | Admitting: Family

## 2022-10-22 VITALS — BP 124/76 | HR 65 | Temp 98.6°F | Ht 62.0 in | Wt 124.2 lb

## 2022-10-22 DIAGNOSIS — D510 Vitamin B12 deficiency anemia due to intrinsic factor deficiency: Secondary | ICD-10-CM

## 2022-10-22 DIAGNOSIS — E663 Overweight: Secondary | ICD-10-CM | POA: Diagnosis not present

## 2022-10-22 DIAGNOSIS — Z0000 Encounter for general adult medical examination without abnormal findings: Secondary | ICD-10-CM | POA: Diagnosis not present

## 2022-10-22 DIAGNOSIS — Z8639 Personal history of other endocrine, nutritional and metabolic disease: Secondary | ICD-10-CM

## 2022-10-22 DIAGNOSIS — Z13220 Encounter for screening for lipoid disorders: Secondary | ICD-10-CM

## 2022-10-22 DIAGNOSIS — Z136 Encounter for screening for cardiovascular disorders: Secondary | ICD-10-CM

## 2022-10-22 DIAGNOSIS — Z1231 Encounter for screening mammogram for malignant neoplasm of breast: Secondary | ICD-10-CM

## 2022-10-22 MED ORDER — ZEPBOUND 2.5 MG/0.5ML ~~LOC~~ SOAJ: 2.5000 mg | SUBCUTANEOUS | 2 refills | Status: DC

## 2022-10-22 MED ORDER — ZEPBOUND 7.5 MG/0.5ML ~~LOC~~ SOAJ: 7.5000 mg | SUBCUTANEOUS | 3 refills | Status: DC

## 2022-10-22 MED ORDER — TIRZEPATIDE 7.5 MG/0.5ML ~~LOC~~ SOAJ: 7.5000 mg | SUBCUTANEOUS | 1 refills | Status: DC

## 2022-10-22 NOTE — Assessment & Plan Note (Signed)
Patient is on a B12 and has no recollection of being diagnosed with pernicious anemia.  I do not see in labs intrinsic factor being ordered.  I advised that we need to order these labs for clarity to distinguish if she does have a previous anemia as that would warrant gastric cancer gastric cancer screening with GI, endoscopy.  She will have these labs done at [facility]

## 2022-10-22 NOTE — Patient Instructions (Addendum)
Nice to see you.  I have sent in Zepbound 7.'5mg'$  to trial if available in the pharmacy.    If not available, you may continue Mounjaro 7.5 mg  Health Maintenance for Postmenopausal Women Menopause is a normal process in which your ability to get pregnant comes to an end. This process happens slowly over many months or years, usually between the ages of 73 and 50. Menopause is complete when you have missed your menstrual period for 12 months. It is important to talk with your health care provider about some of the most common conditions that affect women after menopause (postmenopausal women). These include heart disease, cancer, and bone loss (osteoporosis). Adopting a healthy lifestyle and getting preventive care can help to promote your health and wellness. The actions you take can also lower your chances of developing some of these common conditions. What are the signs and symptoms of menopause? During menopause, you may have the following symptoms: Hot flashes. These can be moderate or severe. Night sweats. Decrease in sex drive. Mood swings. Headaches. Tiredness (fatigue). Irritability. Memory problems. Problems falling asleep or staying asleep. Talk with your health care provider about treatment options for your symptoms. Do I need hormone replacement therapy? Hormone replacement therapy is effective in treating symptoms that are caused by menopause, such as hot flashes and night sweats. Hormone replacement carries certain risks, especially as you become older. If you are thinking about using estrogen or estrogen with progestin, discuss the benefits and risks with your health care provider. How can I reduce my risk for heart disease and stroke? The risk of heart disease, heart attack, and stroke increases as you age. One of the causes may be a change in the body's hormones during menopause. This can affect how your body uses dietary fats, triglycerides, and cholesterol. Heart attack and  stroke are medical emergencies. There are many things that you can do to help prevent heart disease and stroke. Watch your blood pressure High blood pressure causes heart disease and increases the risk of stroke. This is more likely to develop in people who have high blood pressure readings or are overweight. Have your blood pressure checked: Every 3-5 years if you are 53-72 years of age. Every year if you are 5 years old or older. Eat a healthy diet  Eat a diet that includes plenty of vegetables, fruits, low-fat dairy products, and lean protein. Do not eat a lot of foods that are high in solid fats, added sugars, or sodium. Get regular exercise Get regular exercise. This is one of the most important things you can do for your health. Most adults should: Try to exercise for at least 150 minutes each week. The exercise should increase your heart rate and make you sweat (moderate-intensity exercise). Try to do strengthening exercises at least twice each week. Do these in addition to the moderate-intensity exercise. Spend less time sitting. Even light physical activity can be beneficial. Other tips Work with your health care provider to achieve or maintain a healthy weight. Do not use any products that contain nicotine or tobacco. These products include cigarettes, chewing tobacco, and vaping devices, such as e-cigarettes. If you need help quitting, ask your health care provider. Know your numbers. Ask your health care provider to check your cholesterol and your blood sugar (glucose). Continue to have your blood tested as directed by your health care provider. Do I need screening for cancer? Depending on your health history and family history, you may need to have cancer  screenings at different stages of your life. This may include screening for: Breast cancer. Cervical cancer. Lung cancer. Colorectal cancer. What is my risk for osteoporosis? After menopause, you may be at increased risk for  osteoporosis. Osteoporosis is a condition in which bone destruction happens more quickly than new bone creation. To help prevent osteoporosis or the bone fractures that can happen because of osteoporosis, you may take the following actions: If you are 75-71 years old, get at least 1,000 mg of calcium and at least 600 international units (IU) of vitamin D per day. If you are older than age 101 but younger than age 1, get at least 1,200 mg of calcium and at least 600 international units (IU) of vitamin D per day. If you are older than age 87, get at least 1,200 mg of calcium and at least 800 international units (IU) of vitamin D per day. Smoking and drinking excessive alcohol increase the risk of osteoporosis. Eat foods that are rich in calcium and vitamin D, and do weight-bearing exercises several times each week as directed by your health care provider. How does menopause affect my mental health? Depression may occur at any age, but it is more common as you become older. Common symptoms of depression include: Feeling depressed. Changes in sleep patterns. Changes in appetite or eating patterns. Feeling an overall lack of motivation or enjoyment of activities that you previously enjoyed. Frequent crying spells. Talk with your health care provider if you think that you are experiencing any of these symptoms. General instructions See your health care provider for regular wellness exams and vaccines. This may include: Scheduling regular health, dental, and eye exams. Getting and maintaining your vaccines. These include: Influenza vaccine. Get this vaccine each year before the flu season begins. Pneumonia vaccine. Shingles vaccine. Tetanus, diphtheria, and pertussis (Tdap) booster vaccine. Your health care provider may also recommend other immunizations. Tell your health care provider if you have ever been abused or do not feel safe at home. Summary Menopause is a normal process in which your  ability to get pregnant comes to an end. This condition causes hot flashes, night sweats, decreased interest in sex, mood swings, headaches, or lack of sleep. Treatment for this condition may include hormone replacement therapy. Take actions to keep yourself healthy, including exercising regularly, eating a healthy diet, watching your weight, and checking your blood pressure and blood sugar levels. Get screened for cancer and depression. Make sure that you are up to date with all your vaccines. This information is not intended to replace advice given to you by your health care provider. Make sure you discuss any questions you have with your health care provider. Document Revised: 03/18/2021 Document Reviewed: 03/18/2021 Elsevier Patient Education  [place].

## 2022-10-22 NOTE — Progress Notes (Signed)
Assessment & Plan:  Routine general medical examination at a health care facility Assessment & Plan: Clinical breast exam performed today.  Deferred pelvic exam as patient does not have a cervix and she has no pelvic concerns at this time.  Orders: -     3D Screening Mammogram w/Implants, Left and Right; Future  Overweight (BMI 25.0-29.9) Assessment & Plan: BMI 30 prior to phentermine , mounjaro 10/2021. H/o Prediabetes. Congratulated patient on weight loss.  She is tolerating Mounjaro well.  Interested in [place] as indicated for obesity.  She has been off Mounjaro for 3 weeks.  We discussed starting Mounjaro is 2.5 mg to avoid GI side effects, constipation and slowly titrate.  She declines as she has been tolerating Mounjaro and understands the risk of constipation.  She prefers to start with Mounjaro 2.5 or Zepbound 2.5 mg if she is able to get at the pharmacy.  We discussed no further weight loss and the goal is maintenance at this point.  Orders: -     Tirzepatide; Inject 7.5 mg into the skin once a week.  Dispense: 6 mL; Refill: 1 -     VITAMIN D 25 Hydroxy (Vit-D Deficiency, Fractures) -     TSH -     Lipid panel -     Hemoglobin A1c -     Zepbound; Inject 7.5 mg into the skin once a week.  Dispense: 2 mL; Refill: 3  History of vitamin D deficiency -     VITAMIN D 25 Hydroxy (Vit-D Deficiency, Fractures)  Encounter for lipid screening for cardiovascular disease -     Lipid panel -     Hemoglobin A1c  Encounter for screening mammogram for malignant neoplasm of breast -     3D Screening Mammogram w/Implants, Left and Right; Future  Pernicious anemia Assessment & Plan: Patient is on a B12 and has no recollection of being diagnosed with pernicious anemia.  I do not see in labs intrinsic factor being ordered.  I advised that we need to order these labs for clarity to distinguish if she does have a previous anemia as that would warrant gastric cancer gastric cancer screening  with GI, endoscopy.  She will have these labs done at [facility]  Orders: -     Intrinsic Factor Antibodies -     Homocysteine -     Methylmalonic acid, serum -     Celiac Disease Ab Screen w/Rfx -     B12 and Folate Panel     Return precautions given.   Risks, benefits, and alternatives of the medications and treatment plan prescribed today were discussed, and patient expressed understanding.   Education regarding symptom management and diagnosis given to patient on AVS either electronically or printed.  No follow-ups on file.  Mikayla Paris, Mikayla Wagner  Subjective:    Patient ID: Mikayla Wagner, female    DOB: December 19, 1960, 61 y.o.   MRN: [id number]  CC: Mikayla Wagner is a 61 y.o. female who presents today for physical exam.    HPI: She ran out of mounjaro 3 weeks ago and food cravings have returned.  She is worried about gaining weight .   she previously been on 7.5 mg and feels this dose has been very helpful for her.  She is interested in changing to Zepbound , equal dose, for weight maintenance.  She is tolerating regimen.  If she has constipation, she will take prune juice with resolution.  Patient is not taking B12.  She  has no known recollection of being diagnosed with pernicious anemia.  It is on her chart however  She has lost 40lbs.  Previous BMI 30 10/2021  Colorectal Cancer Screening: UTD , Dr Bary Castilla 12/10/2016 Breast Cancer Screening: Mammogram UTD Cervical Cancer Screening: h/o hysterectomy.  Cysts in uterus and outside of uterus. No h/o uterine cancer.  no cervix and no ovaries. Declines pap smear.   Bone Health screening/DEXA for 65+: UTD  Lung Cancer Screening: Doesn't have 20 year pack year history and age > 20 years yo 53 years         Tetanus -UTD Labs: Screening labs today. Exercise: Gets regular exercise, walking.    Alcohol use:  occasional Smoking/tobacco use: Nonsmoker.    Health Maintenance  Topic Date Due   COVID-19 Vaccine (3 - Pfizer risk  series) 01/20/2023 (Originally 04/22/2020)   INFLUENZA VACCINE  02/08/2023 (Originally 06/10/2022)   Zoster Vaccines- Shingrix (2 of 2) 04/21/2023 (Originally 10/18/2017)   MAMMOGRAM  01/21/2024   COLONOSCOPY (Pts 45-57yr Insurance coverage will need to be confirmed)  12/10/2026   DTaP/Tdap/Td (2 - Td or Tdap) 09/05/2030   Hepatitis C Screening  Completed   HIV Screening  Completed   HPV VACCINES  Aged Out   PAP SMEAR-Modifier  Discontinued    ALLERGIES: Codeine, Erythromycin, Morphine sulfate, and Latex  Current Outpatient Medications on File Prior to Visit  Medication Sig Dispense Refill   cyclobenzaprine (FLEXERIL) 5 MG tablet Take 1 tablet (5 mg total) by mouth 3 (three) times daily as needed. 15 tablet 0   famotidine (PEPCID) 20 MG tablet TAKE 1 TABLET(20 MG) BY MOUTH TWICE DAILY AS NEEDED FOR HEARTBURN OR INDIGESTION 180 tablet 1   oxyCODONE-acetaminophen (PERCOCET) 5-325 MG tablet Take 1 tablet by mouth every 6 (six) hours as needed for severe pain. 20 tablet 0   metoCLOPramide (REGLAN) 10 MG tablet Take 1 tablet (10 mg total) by mouth 3 (three) times daily with meals for 10 days. 30 tablet 0   Current Facility-Administered Medications on File Prior to Visit  Medication Dose Route Frequency Provider Last Rate Last Admin   cyanocobalamin ((VITAMIN B-12)) injection 1,000 mcg  1,000 mcg Intramuscular Once WJackolyn Confer MD        Review of Systems  Constitutional:  Negative for chills and fever.  Respiratory:  Negative for cough.   Cardiovascular:  Negative for chest pain and palpitations.  Gastrointestinal:  Negative for abdominal pain, nausea and vomiting.      Objective:    BP 124/76   Pulse 65   Temp 98.6 F (37 C) (Oral)   Ht '5\' 2"'$  (1.575 m)   Wt 124 lb 3.2 oz (56.3 kg)   SpO2 99%   BMI 22.72 kg/m   BP Readings from Last 3 Encounters:  10/22/22 124/76  06/13/22 132/73  04/09/22 126/78   Wt Readings from Last 3 Encounters:  10/22/22 124 lb 3.2 oz (56.3 kg)   06/13/22 143 lb (64.9 kg)  04/09/22 149 lb 12.8 oz (67.9 kg)    Physical Exam Vitals reviewed.  Constitutional:      Appearance: Normal appearance. She is well-developed.  Eyes:     Conjunctiva/sclera: Conjunctivae normal.  Neck:     Thyroid: No thyroid mass or thyromegaly.  Cardiovascular:     Rate and Rhythm: Normal rate and regular rhythm.     Pulses: Normal pulses.     Heart sounds: Normal heart sounds.  Pulmonary:     Effort: Pulmonary effort is normal.  Breath sounds: Normal breath sounds. No wheezing, rhonchi or rales.  Chest:  Breasts:    Breasts are symmetrical.     Right: No inverted nipple, mass, nipple discharge, skin change or tenderness.     Left: No inverted nipple, mass, nipple discharge, skin change or tenderness.  Abdominal:     General: Bowel sounds are normal. There is no distension.     Palpations: Abdomen is soft. Abdomen is not rigid. There is no fluid wave or mass.     Tenderness: There is no abdominal tenderness. There is no guarding or rebound.  Lymphadenopathy:     Head:     Right side of head: No submental, submandibular, tonsillar, preauricular, posterior auricular or occipital adenopathy.     Left side of head: No submental, submandibular, tonsillar, preauricular, posterior auricular or occipital adenopathy.     Cervical: No cervical adenopathy.     Right cervical: No superficial, deep or posterior cervical adenopathy.    Left cervical: No superficial, deep or posterior cervical adenopathy.  Skin:    General: Skin is warm and dry.  Neurological:     Mental Status: She is alert.  Psychiatric:        Speech: Speech normal.        Behavior: Behavior normal.        Thought Content: Thought content normal.

## 2022-10-22 NOTE — Assessment & Plan Note (Signed)
BMI 30 prior to phentermine , mounjaro 10/2021. H/o Prediabetes. Congratulated patient on weight loss.  She is tolerating Mounjaro well.  Interested in [place] as indicated for obesity.  She has been off Mounjaro for 3 weeks.  We discussed starting Mounjaro is 2.5 mg to avoid GI side effects, constipation and slowly titrate.  She declines as she has been tolerating Mounjaro and understands the risk of constipation.  She prefers to start with Mounjaro 2.5 or Zepbound 2.5 mg if she is able to get at the pharmacy.  We discussed no further weight loss and the goal is maintenance at this point.

## 2022-10-22 NOTE — Assessment & Plan Note (Signed)
Clinical breast exam performed today.  Deferred pelvic exam as patient does not have a cervix and she has no pelvic concerns at this time.

## 2022-10-27 ENCOUNTER — Other Ambulatory Visit (HOSPITAL_COMMUNITY): Payer: Self-pay

## 2022-11-19 ENCOUNTER — Other Ambulatory Visit: Payer: Self-pay | Admitting: Family

## 2022-11-19 DIAGNOSIS — K219 Gastro-esophageal reflux disease without esophagitis: Secondary | ICD-10-CM

## 2022-11-19 LAB — LIPID PANEL
Chol/HDL Ratio: 3.3 ratio (ref 0.0–4.4)
Cholesterol, Total: 195 mg/dL (ref 100–199)
HDL: 60 mg/dL (ref >39)
LDL Chol Calc (NIH): 123 mg/dL — ABNORMAL HIGH (ref 0–99)
Triglycerides: 65 mg/dL (ref 0–149)
VLDL Cholesterol Cal: 12 mg/dL (ref 5–40)

## 2022-11-19 LAB — HEMOGLOBIN A1C
Est. average glucose Bld gHb Est-mCnc: 103 mg/dL
Hgb A1c MFr Bld: 5.2 % (ref 4.8–5.6)

## 2022-11-19 LAB — VITAMIN D 25 HYDROXY (VIT D DEFICIENCY, FRACTURES): Vit D, 25-Hydroxy: 26.7 ng/mL — ABNORMAL LOW (ref 30.0–100.0)

## 2022-11-19 LAB — TSH: TSH: 2.38 u[IU]/mL (ref 0.450–4.500)

## 2022-11-20 ENCOUNTER — Encounter: Payer: Self-pay | Admitting: Family

## 2022-11-20 DIAGNOSIS — E538 Deficiency of other specified B group vitamins: Secondary | ICD-10-CM | POA: Insufficient documentation

## 2022-11-22 ENCOUNTER — Encounter: Payer: Self-pay | Admitting: Family

## 2022-11-22 LAB — INTRINSIC FACTOR ANTIBODIES: Intrinsic Factor Abs, Serum: 1.1 AU/mL (ref 0.0–1.1)

## 2022-11-22 LAB — CELIAC DISEASE AB SCREEN W/RFX
Antigliadin Abs, IgA: 3 units (ref 0–19)
IgA/Immunoglobulin A, Serum: 250 mg/dL (ref 87–352)
Transglutaminase IgA: <2 U/mL (ref 0–3)

## 2022-11-22 LAB — HOMOCYSTEINE: Homocysteine: 7.5 umol/L (ref 0.0–17.2)

## 2022-11-22 LAB — B12 AND FOLATE PANEL
Folate: 7.4 ng/mL (ref >3.0)
Vitamin B-12: 1448 pg/mL — ABNORMAL HIGH (ref 232–1245)

## 2022-11-22 LAB — METHYLMALONIC ACID, SERUM: Methylmalonic Acid: 97 nmol/L (ref 0–378)

## 2022-11-24 ENCOUNTER — Telehealth: Payer: Self-pay | Admitting: Family

## 2022-11-24 NOTE — Telephone Encounter (Signed)
Pt called in staying that she sent a message through MyChart for Dr. Vidal Schwalbe under non-urgent message concerning lad results. She will like to F/U on this conversation, she's available at [phone].

## 2022-11-26 NOTE — Telephone Encounter (Signed)
noted 

## 2022-11-26 NOTE — Telephone Encounter (Signed)
Pt has already viewed the results and she stated that she had been taking the B12 5000 mcg and that may be the reason her b12 was elevated.

## 2022-11-27 ENCOUNTER — Telehealth: Payer: Self-pay | Admitting: Family

## 2022-11-27 NOTE — Telephone Encounter (Signed)
Patient called and wanted to know high is too high and what needs to be done.

## 2022-11-28 ENCOUNTER — Telehealth: Payer: Self-pay | Admitting: Family

## 2022-12-01 NOTE — Telephone Encounter (Signed)
Spoke to pt and explained to her about discontinuing the B12 pt verbalized understanding. Pt has stopped taking B12.

## 2023-01-26 ENCOUNTER — Ambulatory Visit
Admission: RE | Admit: 2023-01-26 | Discharge: 2023-01-26 | Disposition: A | Discharge status: Home / Self Care | Payer: Managed Care, Other (non HMO) | Source: Ambulatory Visit | Attending: Family | Admitting: Family

## 2023-01-26 DIAGNOSIS — Z1231 Encounter for screening mammogram for malignant neoplasm of breast: Secondary | ICD-10-CM | POA: Diagnosis not present

## 2023-01-26 DIAGNOSIS — Z0000 Encounter for general adult medical examination without abnormal findings: Secondary | ICD-10-CM | POA: Insufficient documentation

## 2023-02-03 ENCOUNTER — Other Ambulatory Visit: Payer: Self-pay | Admitting: Family

## 2023-02-03 DIAGNOSIS — E663 Overweight: Secondary | ICD-10-CM

## 2023-02-20 ENCOUNTER — Telehealth: Payer: Self-pay | Admitting: Pharmacy Technician

## 2023-02-20 NOTE — Telephone Encounter (Signed)
Patient Advocate Encounter   Received notification that prior authorization for Mounjaro 7.5MG /0.5ML pen-injectors is required.   PA submitted on 02/20/2023 Key [id number] Insurance [employer] Electronic Prior Authorization Form Status is pending

## 2023-02-28 NOTE — Telephone Encounter (Signed)
PA Denied: Letter scanned into chart

## 2023-03-05 NOTE — Telephone Encounter (Signed)
Already denied and documented in separate encounter, please sign off as PA team doesn't have the ability to sign off on Rx Requests, thank you.

## 2023-08-14 ENCOUNTER — Encounter: Payer: Self-pay | Admitting: Family

## 2023-08-27 ENCOUNTER — Other Ambulatory Visit (HOSPITAL_COMMUNITY): Payer: Self-pay

## 2023-09-04 ENCOUNTER — Other Ambulatory Visit: Payer: Self-pay | Admitting: Family

## 2023-09-04 DIAGNOSIS — E663 Overweight: Secondary | ICD-10-CM

## 2023-09-07 ENCOUNTER — Ambulatory Visit: Payer: Managed Care, Other (non HMO) | Admitting: Family

## 2023-09-07 ENCOUNTER — Encounter: Payer: Self-pay | Admitting: Family

## 2023-09-07 VITALS — BP 128/84 | HR 69 | Ht 62.0 in | Wt 115.2 lb

## 2023-09-07 DIAGNOSIS — E663 Overweight: Secondary | ICD-10-CM | POA: Diagnosis not present

## 2023-09-07 DIAGNOSIS — E559 Vitamin D deficiency, unspecified: Secondary | ICD-10-CM

## 2023-09-07 DIAGNOSIS — F909 Attention-deficit hyperactivity disorder, unspecified type: Secondary | ICD-10-CM | POA: Diagnosis not present

## 2023-09-07 MED ORDER — AMPHETAMINE-DEXTROAMPHETAMINE 20 MG PO TABS: 20.0000 mg | ORAL_TABLET | Freq: Every day | ORAL | 0 refills | Status: DC

## 2023-09-07 MED ORDER — ZEPBOUND 7.5 MG/0.5ML ~~LOC~~ SOAJ: 7.5000 mg | SUBCUTANEOUS | 11 refills | Status: DC

## 2023-09-07 MED ORDER — TIRZEPATIDE 7.5 MG/0.5ML ~~LOC~~ SOAJ: 7.5000 mg | SUBCUTANEOUS | 1 refills | Status: DC

## 2023-09-07 NOTE — Progress Notes (Signed)
New Patient Office Visit  Subjective    Patient ID: Mikayla Wagner, female    DOB: 12/25/1960  Age: 62 y.o. MRN: [id number]  CC:  Chief Complaint  Patient presents with   Establish Care    NPE    HPI Mikayla Wagner presents to establish care.  she does have additional concerns to discuss today.   She is feeling well overall, but reports that she would like to look for additional options for her weight.  She also wonders if she might need to be put back on her adderall, says that she has been on it previously.  She does have a diagnosis of ADD in the past, and her two children are on medication as well.  Has had a change in job at [employer], in a new department and is having to learn a lot of new things.      Outpatient Encounter Medications as of 09/07/2023  Medication Sig   famotidine (PEPCID) 20 MG tablet TAKE 1 TABLET(20 MG) BY MOUTH TWICE DAILY AS NEEDED FOR HEARTBURN OR INDIGESTION   [DISCONTINUED] amphetamine-dextroamphetamine (ADDERALL) 20 MG tablet Take 1 tablet (20 mg total) by mouth daily.   [DISCONTINUED] Tirzepatide-Weight Management (ZEPBOUND) 7.5 MG/0.5ML SOAJ Inject 7.5 mg into the skin once a week.   [DISCONTINUED] cyclobenzaprine (FLEXERIL) 5 MG tablet Take 1 tablet (5 mg total) by mouth 3 (three) times daily as needed. (Patient not taking: Reported on 09/07/2023)   [DISCONTINUED] metoCLOPramide (REGLAN) 10 MG tablet Take 1 tablet (10 mg total) by mouth 3 (three) times daily with meals for 10 days. (Patient not taking: Reported on 09/07/2023)   [DISCONTINUED] tirzepatide (MOUNJARO) 7.5 MG/0.5ML Pen Inject 7.5 mg into the skin once a week. (Patient not taking: Reported on 09/07/2023)   [DISCONTINUED] tirzepatide (MOUNJARO) 7.5 MG/0.5ML Pen Inject 7.5 mg into the skin once a week.   [DISCONTINUED] cyanocobalamin ((VITAMIN B-12)) injection 1,000 mcg    No facility-administered encounter medications on file as of 09/07/2023.    Past Medical History:  Diagnosis  Date   Abnormal mammogram    2013, plan to repeat mammogram 05/2013   Ovarian cyst, complex 2010   s/p removal    Past Surgical History:  Procedure Laterality Date   ABDOMINAL HYSTERECTOMY     h/o two uteruses, two cervixs;Cysts in uterus and outside of uterus. NO UTERINE CANCER; no cervix and no ovaries. Declines pap smear.   APPENDECTOMY     AUGMENTATION MAMMAPLASTY Bilateral 2006   saline   BREAST ENHANCEMENT SURGERY  2006   CESAREAN SECTION     4x   COLON SURGERY     COLONOSCOPY WITH PROPOFOL N/A 12/10/2016   Procedure: COLONOSCOPY WITH PROPOFOL;  Surgeon: Earline Mayotte, MD;  Location: [facility] ENDOSCOPY;  Service: Endoscopy;  Laterality: N/A;   DILATION AND CURETTAGE OF UTERUS  2005   PARTIAL COLECTOMY  2005   ruptured diverticuli, s/p colostomy and reversal   SCAR REVISION  2011    Family History  Problem Relation Age of Onset   Thyroid disease Sister    Cancer Paternal Aunt        ovarian cancer   Breast cancer Neg Hx    Diabetes Neg Hx    Thyroid cancer Neg Hx     Social History   Socioeconomic History   Marital status: Single    Spouse name: Not on file   Number of children: Not on file   Years of education: Not on file   Highest  education level: Not on file  Occupational History   Not on file  Tobacco Use   Smoking status: Never   Smokeless tobacco: Never  Vaping Use   Vaping status: Never Used  Substance and Sexual Activity   Alcohol use: Yes    Comment: socially   Drug use: No   Sexual activity: Not on file  Other Topics Concern   Not on file  Social History Narrative   Works at [employer].   Divorced.   Lives in [place] with 15YO son. Cat. Exercises regularly. Diet regular.   Has 3 daughter and one son. One daughter has CP.       Both parents died in airline crash and she was raised by her older sister.       She lost her parents when she was 8 in plane crash   Social Determinants of Health   Financial Resource Strain: Not on file  Food  Insecurity: Not on file  Transportation Needs: Not on file  Physical Activity: Not on file  Stress: Not on file  Social Connections: Not on file  Intimate Partner Violence: Not on file    Review of Systems  All other systems reviewed and are negative.       Objective    BP 128/84   Pulse 69   Ht 5\' 2"  (1.575 m)   Wt 115 lb 3.2 oz (52.3 kg)   SpO2 100%   BMI 21.07 kg/m   Physical Exam Vitals and nursing note reviewed.  Constitutional:      Appearance: Normal appearance. She is normal weight.  HENT:     Head: Normocephalic.  Eyes:     Pupils: Pupils are equal, round, and reactive to light.  Cardiovascular:     Rate and Rhythm: Normal rate.  Pulmonary:     Effort: Pulmonary effort is normal.  Neurological:     General: No focal deficit present.     Mental Status: She is alert and oriented to person, place, and time. Mental status is at baseline.  Psychiatric:        Mood and Affect: Mood normal.        Behavior: Behavior normal.        Thought Content: Thought content normal.        Judgment: Judgment normal.        Assessment & Plan:   Problem List Items Addressed This Visit       Active Problems   Overweight (BMI 25.0-29.9)   ADD (attention deficit disorder)   Vitamin D deficiency - Primary   Starting pt back on her adderall that she was on previously. She will let me know if dose needs adjustment.   Sending RX for tirzepatide for her as well for her weight.   Return in about 2 months (around 11/07/2023) for CPE.    Total time spent: 30 minutes  Mikayla Kins, FNP  09/07/2023  This document may have been prepared by [facility] Voice Recognition software and as such may include unintentional dictation errors.

## 2023-10-02 ENCOUNTER — Other Ambulatory Visit: Payer: Self-pay | Admitting: Family

## 2023-10-02 DIAGNOSIS — F909 Attention-deficit hyperactivity disorder, unspecified type: Secondary | ICD-10-CM

## 2023-10-05 ENCOUNTER — Encounter: Payer: Self-pay | Admitting: Family

## 2023-10-05 DIAGNOSIS — F909 Attention-deficit hyperactivity disorder, unspecified type: Secondary | ICD-10-CM

## 2023-10-07 ENCOUNTER — Other Ambulatory Visit: Payer: Self-pay | Admitting: Family

## 2023-10-07 DIAGNOSIS — F909 Attention-deficit hyperactivity disorder, unspecified type: Secondary | ICD-10-CM

## 2023-10-07 MED ORDER — AMPHETAMINE-DEXTROAMPHETAMINE 20 MG PO TABS: 20.0000 mg | ORAL_TABLET | Freq: Every day | ORAL | 0 refills | Status: DC

## 2023-10-11 ENCOUNTER — Encounter: Payer: Self-pay | Admitting: Family

## 2023-10-29 ENCOUNTER — Encounter: Payer: Managed Care, Other (non HMO) | Admitting: Family

## 2023-11-02 ENCOUNTER — Encounter: Payer: Self-pay | Admitting: Family

## 2023-11-02 ENCOUNTER — Ambulatory Visit: Payer: Managed Care, Other (non HMO) | Admitting: Family

## 2023-11-02 VITALS — BP 120/80 | HR 86 | Ht 62.0 in | Wt 111.2 lb

## 2023-11-02 DIAGNOSIS — Z0130 Encounter for examination of blood pressure without abnormal findings: Secondary | ICD-10-CM

## 2023-11-02 DIAGNOSIS — Z0001 Encounter for general adult medical examination with abnormal findings: Secondary | ICD-10-CM | POA: Diagnosis not present

## 2023-11-02 DIAGNOSIS — Z1231 Encounter for screening mammogram for malignant neoplasm of breast: Secondary | ICD-10-CM

## 2023-11-02 DIAGNOSIS — Z0000 Encounter for general adult medical examination without abnormal findings: Secondary | ICD-10-CM

## 2023-11-02 DIAGNOSIS — F909 Attention-deficit hyperactivity disorder, unspecified type: Secondary | ICD-10-CM | POA: Diagnosis not present

## 2023-11-02 MED ORDER — AMPHETAMINE-DEXTROAMPHETAMINE 20 MG PO TABS: 20.0000 mg | ORAL_TABLET | Freq: Two times a day (BID) | ORAL | 0 refills | Status: DC

## 2023-11-30 ENCOUNTER — Other Ambulatory Visit: Payer: Self-pay | Admitting: Family

## 2023-11-30 DIAGNOSIS — F909 Attention-deficit hyperactivity disorder, unspecified type: Secondary | ICD-10-CM

## 2023-12-01 MED ORDER — AMPHETAMINE-DEXTROAMPHETAMINE 20 MG PO TABS: 20.0000 mg | ORAL_TABLET | Freq: Two times a day (BID) | ORAL | 0 refills | Status: DC

## 2023-12-31 ENCOUNTER — Other Ambulatory Visit: Payer: Self-pay | Admitting: Family

## 2023-12-31 DIAGNOSIS — F909 Attention-deficit hyperactivity disorder, unspecified type: Secondary | ICD-10-CM

## 2024-01-01 MED ORDER — AMPHETAMINE-DEXTROAMPHETAMINE 20 MG PO TABS: 20.0000 mg | ORAL_TABLET | Freq: Two times a day (BID) | ORAL | 0 refills | Status: DC

## 2024-01-27 ENCOUNTER — Ambulatory Visit
Admission: RE | Admit: 2024-01-27 | Discharge: 2024-01-27 | Disposition: A | Discharge status: Home / Self Care | Payer: Managed Care, Other (non HMO) | Source: Ambulatory Visit | Attending: Family | Admitting: Family

## 2024-01-27 DIAGNOSIS — Z1231 Encounter for screening mammogram for malignant neoplasm of breast: Secondary | ICD-10-CM | POA: Insufficient documentation

## 2024-02-01 ENCOUNTER — Other Ambulatory Visit: Payer: Self-pay | Admitting: Family

## 2024-02-01 ENCOUNTER — Ambulatory Visit: Payer: Managed Care, Other (non HMO) | Admitting: Family

## 2024-02-01 ENCOUNTER — Encounter: Payer: Self-pay | Admitting: Family

## 2024-02-01 VITALS — BP 106/64 | HR 86 | Ht 62.0 in | Wt 112.0 lb

## 2024-02-01 DIAGNOSIS — F909 Attention-deficit hyperactivity disorder, unspecified type: Secondary | ICD-10-CM

## 2024-02-01 DIAGNOSIS — Z0130 Encounter for examination of blood pressure without abnormal findings: Secondary | ICD-10-CM

## 2024-02-01 DIAGNOSIS — E663 Overweight: Secondary | ICD-10-CM

## 2024-02-01 MED ORDER — ZEPBOUND 2.5 MG/0.5ML ~~LOC~~ SOAJ: 2.5000 mg | SUBCUTANEOUS | 0 refills | Status: DC

## 2024-02-01 MED ORDER — AMPHETAMINE-DEXTROAMPHETAMINE 20 MG PO TABS: 20.0000 mg | ORAL_TABLET | Freq: Two times a day (BID) | ORAL | 0 refills | Status: DC

## 2024-02-03 ENCOUNTER — Other Ambulatory Visit: Payer: Self-pay | Admitting: Family

## 2024-02-03 DIAGNOSIS — E663 Overweight: Secondary | ICD-10-CM

## 2024-02-29 ENCOUNTER — Other Ambulatory Visit: Payer: Self-pay | Admitting: Family

## 2024-02-29 DIAGNOSIS — F909 Attention-deficit hyperactivity disorder, unspecified type: Secondary | ICD-10-CM

## 2024-02-29 MED ORDER — AMPHETAMINE-DEXTROAMPHETAMINE 20 MG PO TABS: 20.0000 mg | ORAL_TABLET | Freq: Two times a day (BID) | ORAL | 0 refills | Status: DC

## 2024-03-29 ENCOUNTER — Other Ambulatory Visit: Payer: Self-pay

## 2024-03-29 ENCOUNTER — Encounter: Payer: Self-pay | Admitting: Family

## 2024-03-29 DIAGNOSIS — F909 Attention-deficit hyperactivity disorder, unspecified type: Secondary | ICD-10-CM

## 2024-03-31 MED ORDER — AMPHETAMINE-DEXTROAMPHETAMINE 20 MG PO TABS: 20.0000 mg | ORAL_TABLET | Freq: Two times a day (BID) | ORAL | 0 refills | Status: DC

## 2024-04-01 ENCOUNTER — Other Ambulatory Visit: Payer: Self-pay

## 2024-04-01 DIAGNOSIS — F909 Attention-deficit hyperactivity disorder, unspecified type: Secondary | ICD-10-CM

## 2024-04-01 MED ORDER — AMPHETAMINE-DEXTROAMPHETAMINE 20 MG PO TABS: 20.0000 mg | ORAL_TABLET | Freq: Two times a day (BID) | ORAL | 0 refills | Status: DC

## 2024-04-06 ENCOUNTER — Ambulatory Visit: Admitting: Physician Assistant

## 2024-04-08 ENCOUNTER — Encounter: Payer: Self-pay | Admitting: Family

## 2024-04-08 ENCOUNTER — Ambulatory Visit: Admitting: Family

## 2024-04-08 VITALS — BP 124/84 | HR 80 | Ht 62.0 in | Wt 112.0 lb

## 2024-04-08 DIAGNOSIS — F909 Attention-deficit hyperactivity disorder, unspecified type: Secondary | ICD-10-CM | POA: Diagnosis not present

## 2024-04-08 DIAGNOSIS — F5101 Primary insomnia: Secondary | ICD-10-CM

## 2024-04-08 DIAGNOSIS — Z0130 Encounter for examination of blood pressure without abnormal findings: Secondary | ICD-10-CM

## 2024-04-08 MED ORDER — ONDANSETRON 8 MG PO TBDP: 8.0000 mg | ORAL_TABLET | Freq: Three times a day (TID) | ORAL | 0 refills | Status: DC | PRN

## 2024-04-08 MED ORDER — AMPHETAMINE-DEXTROAMPHETAMINE 10 MG PO TABS: 10.0000 mg | ORAL_TABLET | Freq: Two times a day (BID) | ORAL | 0 refills | Status: DC | PRN

## 2024-04-08 MED ORDER — BELSOMRA 5 MG PO TABS: 1.0000 | ORAL_TABLET | Freq: Every evening | ORAL | 0 refills | Status: DC | PRN

## 2024-04-15 ENCOUNTER — Encounter: Payer: Self-pay | Admitting: Family

## 2024-04-18 ENCOUNTER — Other Ambulatory Visit: Payer: Self-pay

## 2024-04-18 MED ORDER — ZALEPLON 10 MG PO CAPS: 10.0000 mg | ORAL_CAPSULE | Freq: Every evening | ORAL | 1 refills | Status: DC | PRN

## 2024-05-04 ENCOUNTER — Encounter: Payer: Self-pay | Admitting: Family

## 2024-05-04 DIAGNOSIS — F909 Attention-deficit hyperactivity disorder, unspecified type: Secondary | ICD-10-CM

## 2024-05-04 MED ORDER — AMPHETAMINE-DEXTROAMPHETAMINE 20 MG PO TABS
20.0000 mg | ORAL_TABLET | Freq: Two times a day (BID) | ORAL | 0 refills | Status: DC
Start: 2024-05-04 — End: 2024-05-30

## 2024-05-09 ENCOUNTER — Ambulatory Visit: Admitting: Physician Assistant

## 2024-05-09 ENCOUNTER — Other Ambulatory Visit: Payer: Self-pay

## 2024-05-09 ENCOUNTER — Encounter: Payer: Self-pay | Admitting: Family

## 2024-05-09 MED ORDER — AMPHETAMINE-DEXTROAMPHETAMINE 10 MG PO TABS: 10.0000 mg | ORAL_TABLET | Freq: Two times a day (BID) | ORAL | 0 refills | Status: DC | PRN

## 2024-05-16 ENCOUNTER — Encounter: Payer: Self-pay | Admitting: Family

## 2024-05-16 NOTE — Assessment & Plan Note (Signed)
 Zepbound  RX sent to the pharmacy for pt.  Will reassess at follow up.

## 2024-05-16 NOTE — Assessment & Plan Note (Signed)
 Patient stable.  Well controlled with current therapy.   Continue current meds.

## 2024-05-16 NOTE — Progress Notes (Signed)
 Established Patient Office Visit  Subjective:  Patient ID: Mikayla Wagner, female    DOB: Oct 22, 1961  Age: 63 y.o. MRN: [id number]  Chief Complaint  Patient presents with   Follow-up    3 month follow up    Patient is here today for her 3 months follow up.  She has been feeling fairly well since last appointment.   She does have additional concerns to discuss today.  She asks if we can send her zepbound  to the pharmacy.  She also asks for refill for her adderall.   Labs are not due today. She needs refills.   I have reviewed her active problem list, medication list, allergies, notes from last encounter, lab results for her appointment today.      No other concerns at this time.   Past Medical History:  Diagnosis Date   Abnormal mammogram    2013, plan to repeat mammogram 05/2013   Ovarian cyst, complex 2010   s/p removal    Past Surgical History:  Procedure Laterality Date   ABDOMINAL HYSTERECTOMY     h/o two uteruses, two cervixs;Cysts in uterus and outside of uterus. NO UTERINE CANCER; no cervix and no ovaries. Declines pap smear.   APPENDECTOMY     AUGMENTATION MAMMAPLASTY Bilateral 2006   saline   BREAST ENHANCEMENT SURGERY  2006   CESAREAN SECTION     4x   COLON SURGERY     COLONOSCOPY WITH PROPOFOL  N/A 12/10/2016   Procedure: COLONOSCOPY WITH PROPOFOL ;  Surgeon: Reyes LELON Cota, MD;  Location: [facility];  Service: Endoscopy;  Laterality: N/A;   DILATION AND CURETTAGE OF UTERUS  2005   PARTIAL COLECTOMY  2005   ruptured diverticuli, s/p colostomy and reversal   SCAR REVISION  2011    Social History   Socioeconomic History   Marital status: Single    Spouse name: Not on file   Number of children: Not on file   Years of education: Not on file   Highest education level: Not on file  Occupational History   Not on file  Tobacco Use   Smoking status: Never   Smokeless tobacco: Never  Vaping Use   Vaping status: Never Used  Substance and  Sexual Activity   Alcohol use: Yes    Comment: socially   Drug use: No   Sexual activity: Not on file  Other Topics Concern   Not on file  Social History Narrative   Works at [employer].   Divorced.   Lives in [place] with 15YO son. Cat. Exercises regularly. Diet regular.   Has 3 daughter and one son. One daughter has CP.       Both parents died in airline crash and she was raised by her older sister.       She lost her parents when she was 8 in plane crash   Social Drivers of Corporate investment banker Strain: Not on file  Food Insecurity: Not on file  Transportation Needs: Not on file  Physical Activity: Not on file  Stress: Not on file  Social Connections: Not on file  Intimate Partner Violence: Not on file    Family History  Problem Relation Age of Onset   Thyroid  disease Sister    Cancer Paternal Aunt        ovarian cancer   Breast cancer Neg Hx    Diabetes Neg Hx    Thyroid  cancer Neg Hx     Allergies  Allergen Reactions  Codeine Nausea Only   Erythromycin     Nausea and vomiting   Morphine Sulfate Nausea And Vomiting   Latex Rash    Review of Systems  All other systems reviewed and are negative.      Objective:   BP 106/64   Pulse 86   Ht 5' 2 (1.575 m)   Wt 112 lb (50.8 kg)   SpO2 96%   BMI 20.49 kg/m   Vitals:   02/01/24 1115  BP: 106/64  Pulse: 86  Height: 5' 2 (1.575 m)  Weight: 112 lb (50.8 kg)  SpO2: 96%  BMI (Calculated): 20.48    Physical Exam Vitals and nursing note reviewed.  Constitutional:      Appearance: Normal appearance. She is normal weight.  HENT:     Head: Normocephalic.  Eyes:     Extraocular Movements: Extraocular movements intact.     Conjunctiva/sclera: Conjunctivae normal.     Pupils: Pupils are equal, round, and reactive to light.  Cardiovascular:     Rate and Rhythm: Normal rate.  Pulmonary:     Effort: Pulmonary effort is normal.  Musculoskeletal:        General: Normal range of motion.   Neurological:     General: No focal deficit present.     Mental Status: She is alert and oriented to person, place, and time. Mental status is at baseline.  Psychiatric:        Mood and Affect: Mood normal.        Behavior: Behavior normal.        Thought Content: Thought content normal.        Judgment: Judgment normal.      No results found for any visits on 02/01/24.  No results found for this or any previous visit (from the past 2160 hours).     Assessment & Plan Attention deficit hyperactivity disorder (ADHD), unspecified ADHD type Patient stable.  Well controlled with current therapy.   Continue current meds.   Overweight (BMI 25.0-29.9) Zepbound  RX sent to the pharmacy for pt.  Will reassess at follow up.     No follow-ups on file.   Total time spent: 20 minutes  ALAN CHRISTELLA ARRANT, FNP  02/01/2024   This document may have been prepared by [facility] Voice Recognition software and as such may include unintentional dictation errors.

## 2024-05-30 ENCOUNTER — Encounter: Payer: Self-pay | Admitting: Family

## 2024-05-30 DIAGNOSIS — F909 Attention-deficit hyperactivity disorder, unspecified type: Secondary | ICD-10-CM

## 2024-05-31 MED ORDER — AMPHETAMINE-DEXTROAMPHETAMINE 20 MG PO TABS: 20.0000 mg | ORAL_TABLET | Freq: Two times a day (BID) | ORAL | 0 refills | Status: DC

## 2024-06-06 ENCOUNTER — Encounter: Payer: Self-pay | Admitting: Family

## 2024-06-06 ENCOUNTER — Other Ambulatory Visit: Payer: Self-pay

## 2024-06-07 MED ORDER — AMPHETAMINE-DEXTROAMPHETAMINE 10 MG PO TABS: 10.0000 mg | ORAL_TABLET | Freq: Every day | ORAL | 0 refills | Status: DC

## 2024-06-07 MED ORDER — AMPHETAMINE-DEXTROAMPHETAMINE 10 MG PO TABS: 10.0000 mg | ORAL_TABLET | Freq: Two times a day (BID) | ORAL | 0 refills | Status: DC | PRN

## 2024-06-07 MED ORDER — AMPHETAMINE-DEXTROAMPHETAMINE 20 MG PO TABS: 20.0000 mg | ORAL_TABLET | Freq: Every day | ORAL | 0 refills | Status: DC

## 2024-06-07 NOTE — Addendum Note (Signed)
 Addended by: ORLEAN PALMA on: 06/07/2024 08:06 PM   Modules accepted: Orders

## 2024-06-14 ENCOUNTER — Encounter: Payer: Self-pay | Admitting: Family

## 2024-06-14 ENCOUNTER — Other Ambulatory Visit: Payer: Self-pay | Admitting: Family

## 2024-06-14 DIAGNOSIS — E663 Overweight: Secondary | ICD-10-CM

## 2024-06-19 NOTE — Progress Notes (Signed)
 Established Patient Office Visit  Subjective:  Patient ID: Mikayla Wagner, female    DOB: 1961/02/18  Age: 63 y.o. MRN: [id number]  Chief Complaint  Patient presents with   Follow-up    Discuss medications    Patient is here today to discuss her meds.   She is due for refills of her adderall.  She also has sleep issues, asks if there is anything we can try for her for sleep.    No other concerns at this time.   Past Medical History:  Diagnosis Date   Abnormal mammogram    2013, plan to repeat mammogram 05/2013   Ovarian cyst, complex 2010   s/p removal    Past Surgical History:  Procedure Laterality Date   ABDOMINAL HYSTERECTOMY     h/o two uteruses, two cervixs;Cysts in uterus and outside of uterus. NO UTERINE CANCER; no cervix and no ovaries. Declines pap smear.   APPENDECTOMY     AUGMENTATION MAMMAPLASTY Bilateral 2006   saline   BREAST ENHANCEMENT SURGERY  2006   CESAREAN SECTION     4x   COLON SURGERY     COLONOSCOPY WITH PROPOFOL  N/A 12/10/2016   Procedure: COLONOSCOPY WITH PROPOFOL ;  Surgeon: Reyes LELON Cota, MD;  Location: [facility];  Service: Endoscopy;  Laterality: N/A;   DILATION AND CURETTAGE OF UTERUS  2005   PARTIAL COLECTOMY  2005   ruptured diverticuli, s/p colostomy and reversal   SCAR REVISION  2011    Social History   Socioeconomic History   Marital status: Single    Spouse name: Not on file   Number of children: Not on file   Years of education: Not on file   Highest education level: Not on file  Occupational History   Not on file  Tobacco Use   Smoking status: Never   Smokeless tobacco: Never  Vaping Use   Vaping status: Never Used  Substance and Sexual Activity   Alcohol use: Yes    Comment: socially   Drug use: No   Sexual activity: Not on file  Other Topics Concern   Not on file  Social History Narrative   Works at [employer].   Divorced.   Lives in [place] with 15YO son. Cat. Exercises regularly. Diet regular.   Has  3 daughter and one son. One daughter has CP.       Both parents died in airline crash and she was raised by her older sister.       She lost her parents when she was 8 in plane crash   Social Drivers of Corporate investment banker Strain: Not on file  Food Insecurity: Not on file  Transportation Needs: Not on file  Physical Activity: Not on file  Stress: Not on file  Social Connections: Not on file  Intimate Partner Violence: Not on file    Family History  Problem Relation Age of Onset   Thyroid  disease Sister    Cancer Paternal Aunt        ovarian cancer   Breast cancer Neg Hx    Diabetes Neg Hx    Thyroid  cancer Neg Hx     Allergies  Allergen Reactions   Codeine Nausea Only   Erythromycin     Nausea and vomiting   Morphine Sulfate Nausea And Vomiting   Latex Rash    Review of Systems  Psychiatric/Behavioral:  The patient has insomnia.   All other systems reviewed and are negative.  Objective:   BP 124/84   Pulse 80   Ht 5' 2 (1.575 m)   Wt 112 lb (50.8 kg)   SpO2 99%   BMI 20.49 kg/m   Vitals:   04/08/24 1332  BP: 124/84  Pulse: 80  Height: 5' 2 (1.575 m)  Weight: 112 lb (50.8 kg)  SpO2: 99%  BMI (Calculated): 20.48    Physical Exam Vitals and nursing note reviewed.  Constitutional:      Appearance: Normal appearance. She is normal weight.  HENT:     Head: Normocephalic and atraumatic.  Eyes:     Extraocular Movements: Extraocular movements intact.     Conjunctiva/sclera: Conjunctivae normal.     Pupils: Pupils are equal, round, and reactive to light.  Cardiovascular:     Rate and Rhythm: Normal rate.  Pulmonary:     Effort: Pulmonary effort is normal.  Musculoskeletal:        General: Normal range of motion.  Neurological:     General: No focal deficit present.     Mental Status: She is alert and oriented to person, place, and time. Mental status is at baseline.  Psychiatric:        Mood and Affect: Mood normal.         Behavior: Behavior normal.        Thought Content: Thought content normal.        Judgment: Judgment normal.      No results found for any visits on 04/08/24.  No results found for this or any previous visit (from the past 2160 hours).     Assessment & Plan Attention deficit hyperactivity disorder (ADHD), unspecified ADHD type Patient stable.  Well controlled with current therapy.   Continue current meds.   Primary insomnia Sending belsomra  RX for pt to try for her insomnia.  Will let me know if this is working.      No follow-ups on file.   Total time spent: 20 minutes  ALAN CHRISTELLA ARRANT, FNP  04/08/2024   This document may have been prepared by [facility] Voice Recognition software and as such may include unintentional dictation errors.

## 2024-06-19 NOTE — Assessment & Plan Note (Signed)
 Patient stable.  Well controlled with current therapy.   Continue current meds.

## 2024-06-27 ENCOUNTER — Encounter: Payer: Self-pay | Admitting: Family

## 2024-06-30 ENCOUNTER — Telehealth: Payer: Self-pay | Admitting: Family

## 2024-06-30 ENCOUNTER — Other Ambulatory Visit: Payer: Self-pay | Admitting: Family

## 2024-06-30 ENCOUNTER — Other Ambulatory Visit: Payer: Self-pay

## 2024-06-30 DIAGNOSIS — F909 Attention-deficit hyperactivity disorder, unspecified type: Secondary | ICD-10-CM

## 2024-06-30 MED ORDER — AMPHETAMINE-DEXTROAMPHETAMINE 20 MG PO TABS
20.0000 mg | ORAL_TABLET | Freq: Two times a day (BID) | ORAL | 0 refills | Status: DC
Start: 2024-06-30 — End: 2024-08-03

## 2024-06-30 NOTE — Telephone Encounter (Signed)
 Spoke with Duwaine, Rph at [facility] and canceled both Adderall 10 mg and 20 mg that were written for #30 with a sig of 1 tab daily. New Rxs to follow for #60 with a sig of 1 tab BID.

## 2024-07-05 ENCOUNTER — Other Ambulatory Visit: Payer: Self-pay | Admitting: Internal Medicine

## 2024-07-05 DIAGNOSIS — F909 Attention-deficit hyperactivity disorder, unspecified type: Secondary | ICD-10-CM

## 2024-07-05 MED ORDER — AMPHETAMINE-DEXTROAMPHETAMINE 10 MG PO TABS: 10.0000 mg | ORAL_TABLET | Freq: Two times a day (BID) | ORAL | 0 refills | Status: DC

## 2024-08-03 ENCOUNTER — Ambulatory Visit: Admitting: Family

## 2024-08-03 ENCOUNTER — Encounter: Payer: Self-pay | Admitting: Family

## 2024-08-03 VITALS — BP 132/90 | HR 84 | Ht 62.0 in | Wt 117.8 lb

## 2024-08-03 DIAGNOSIS — E538 Deficiency of other specified B group vitamins: Secondary | ICD-10-CM

## 2024-08-03 DIAGNOSIS — Z79899 Other long term (current) drug therapy: Secondary | ICD-10-CM

## 2024-08-03 DIAGNOSIS — E559 Vitamin D deficiency, unspecified: Secondary | ICD-10-CM | POA: Diagnosis not present

## 2024-08-03 DIAGNOSIS — F909 Attention-deficit hyperactivity disorder, unspecified type: Secondary | ICD-10-CM | POA: Diagnosis not present

## 2024-08-03 DIAGNOSIS — K219 Gastro-esophageal reflux disease without esophagitis: Secondary | ICD-10-CM | POA: Diagnosis not present

## 2024-08-03 DIAGNOSIS — Z0130 Encounter for examination of blood pressure without abnormal findings: Secondary | ICD-10-CM

## 2024-08-03 MED ORDER — AMPHETAMINE-DEXTROAMPHETAMINE 20 MG PO TABS: 30.0000 mg | ORAL_TABLET | Freq: Two times a day (BID) | ORAL | 0 refills | Status: DC

## 2024-08-03 NOTE — Assessment & Plan Note (Signed)
-   collect urine tox today. - refill for Adderall sent.

## 2024-08-03 NOTE — Progress Notes (Unsigned)
 Established Patient Office Visit  Subjective:  Patient ID: Mikayla Wagner, female    DOB: 12/06/1960  Age: 63 y.o. MRN: [id number]  Chief Complaint  Patient presents with   Follow-up    6 month follow up     Patient is here today for her 6 months follow up.  She has been feeling fairly well since last appointment.   She does not have additional concerns to discuss today. She reports she never took the sleeping medication prescribed for her trip to [place]. Labs were recently collected by patient's employer for preventative health maintenance. Patient will provide lab results to office so she does not have to have blood work completed again. She needs refills on her Adderall. She reports she ran out a few days ago.  I have reviewed her active problem list, medication list, allergies, family history, social history, health maintenance, notes from last encounter, lab results for her appointment today.      No other concerns at this time.   Past Medical History:  Diagnosis Date   Abnormal mammogram    2013, plan to repeat mammogram 05/2013   Ovarian cyst, complex 2010   s/p removal    Past Surgical History:  Procedure Laterality Date   ABDOMINAL HYSTERECTOMY     h/o two uteruses, two cervixs;Cysts in uterus and outside of uterus. NO UTERINE CANCER; no cervix and no ovaries. Declines pap smear.   APPENDECTOMY     AUGMENTATION MAMMAPLASTY Bilateral 2006   saline   BREAST ENHANCEMENT SURGERY  2006   CESAREAN SECTION     4x   COLON SURGERY     COLONOSCOPY WITH PROPOFOL  N/A 12/10/2016   Procedure: COLONOSCOPY WITH PROPOFOL ;  Surgeon: Reyes LELON Cota, MD;  Location: [facility];  Service: Endoscopy;  Laterality: N/A;   DILATION AND CURETTAGE OF UTERUS  2005   PARTIAL COLECTOMY  2005   ruptured diverticuli, s/p colostomy and reversal   SCAR REVISION  2011    Social History   Socioeconomic History   Marital status: Single    Spouse name: Not on file   Number of  children: Not on file   Years of education: Not on file   Highest education level: Not on file  Occupational History   Not on file  Tobacco Use   Smoking status: Never   Smokeless tobacco: Never  Vaping Use   Vaping status: Never Used  Substance and Sexual Activity   Alcohol use: Yes    Comment: socially   Drug use: No   Sexual activity: Not on file  Other Topics Concern   Not on file  Social History Narrative   Works at [employer].   Divorced.   Lives in [place] with 15YO son. Cat. Exercises regularly. Diet regular.   Has 3 daughter and one son. One daughter has CP.       Both parents died in airline crash and she was raised by her older sister.       She lost her parents when she was 8 in plane crash   Social Drivers of Corporate investment banker Strain: Not on file  Food Insecurity: Not on file  Transportation Needs: Not on file  Physical Activity: Not on file  Stress: Not on file  Social Connections: Not on file  Intimate Partner Violence: Not on file    Family History  Problem Relation Age of Onset   Thyroid  disease Sister    Cancer Paternal Wylie  ovarian cancer   Breast cancer Neg Hx    Diabetes Neg Hx    Thyroid  cancer Neg Hx     Allergies  Allergen Reactions   Codeine Nausea Only   Erythromycin     Nausea and vomiting   Morphine Sulfate Nausea And Vomiting   Latex Rash    Review of Systems  Constitutional:  Negative for malaise/fatigue.  HENT: Negative.    Eyes:  Negative for blurred vision and pain.  Respiratory:  Negative for cough and shortness of breath.   Cardiovascular:  Negative for chest pain, palpitations, claudication and leg swelling.  Gastrointestinal:  Negative for abdominal pain, blood in stool, constipation, diarrhea, nausea and vomiting.  Genitourinary:  Negative for dysuria, frequency and urgency.  Musculoskeletal: Negative.   Skin: Negative.   Neurological:  Negative for dizziness, tingling, sensory change and headaches.   Endo/Heme/Allergies: Negative.   Psychiatric/Behavioral: Negative.         Objective:   BP (!) 132/90   Pulse 84   Ht 5' 2 (1.575 m)   Wt 117 lb 12.8 oz (53.4 kg)   SpO2 99%   BMI 21.55 kg/m   Vitals:   08/03/24 1511  BP: (!) 132/90  Pulse: 84  Height: 5' 2 (1.575 m)  Weight: 117 lb 12.8 oz (53.4 kg)  SpO2: 99%  BMI (Calculated): 21.54    Physical Exam Vitals and nursing note reviewed.  Constitutional:      Appearance: Normal appearance.  HENT:     Head: Normocephalic.  Eyes:     Extraocular Movements: Extraocular movements intact.     Pupils: Pupils are equal, round, and reactive to light.  Cardiovascular:     Rate and Rhythm: Normal rate and regular rhythm.     Pulses: Normal pulses.     Heart sounds: Normal heart sounds. No murmur heard. Pulmonary:     Effort: Pulmonary effort is normal. No respiratory distress.     Breath sounds: Normal breath sounds.  Abdominal:     General: There is no distension.     Tenderness: There is no abdominal tenderness.  Musculoskeletal:        General: No tenderness. Normal range of motion.     Cervical back: Normal range of motion and neck supple.     Right lower leg: No edema.     Left lower leg: No edema.  Skin:    General: Skin is warm and dry.     Coloration: Skin is not jaundiced.     Findings: No erythema.  Neurological:     General: No focal deficit present.     Mental Status: She is alert and oriented to person, place, and time.  Psychiatric:        Mood and Affect: Mood normal.      No results found for any visits on 08/03/24.  No results found for this or any previous visit (from the past 2160 hours).     Assessment & Plan:   Assessment & Plan Long term current use of therapeutic drug Attention deficit hyperactivity disorder (ADHD), unspecified ADHD type - collect urine tox today. - refill for Adderall sent. - blood work recently completed by employer; patient to provide lab results to office  to review.  Return in about 3 months (around 11/02/2024) for F/U.   Total time spent: 25 minutes  Mikayla DELENA Cain, FNP  08/03/2024   This document may have been prepared by [facility] Voice Recognition software and as such may include  unintentional dictation errors.

## 2024-08-04 ENCOUNTER — Encounter: Payer: Self-pay | Admitting: Family

## 2024-08-04 NOTE — Assessment & Plan Note (Signed)
-   blood work recently completed by employer; patient to provide lab results to office to review.

## 2024-08-04 NOTE — Assessment & Plan Note (Signed)
 Patient stable.  Well controlled with current therapy.   Continue current meds.

## 2024-08-06 LAB — TOXASSURE SELECT 13 (MW), URINE

## 2024-08-08 ENCOUNTER — Ambulatory Visit: Payer: Self-pay

## 2024-08-29 ENCOUNTER — Other Ambulatory Visit: Payer: Self-pay

## 2024-08-29 ENCOUNTER — Encounter: Payer: Self-pay | Admitting: Family

## 2024-08-29 DIAGNOSIS — F909 Attention-deficit hyperactivity disorder, unspecified type: Secondary | ICD-10-CM

## 2024-08-29 MED ORDER — AMPHETAMINE-DEXTROAMPHETAMINE 20 MG PO TABS: 30.0000 mg | ORAL_TABLET | Freq: Two times a day (BID) | ORAL | 0 refills | Status: DC

## 2024-08-30 ENCOUNTER — Other Ambulatory Visit: Payer: Self-pay

## 2024-08-30 MED ORDER — ZEPBOUND 7.5 MG/0.5ML ~~LOC~~ SOAJ: 7.5000 mg | SUBCUTANEOUS | 1 refills | Status: AC

## 2024-08-30 MED ORDER — ZEPBOUND 7.5 MG/0.5ML ~~LOC~~ SOAJ: 7.5000 mg | SUBCUTANEOUS | 1 refills | Status: DC

## 2024-09-28 NOTE — Telephone Encounter (Signed)
 open in error

## 2024-10-25 ENCOUNTER — Other Ambulatory Visit: Payer: Self-pay

## 2024-10-25 ENCOUNTER — Encounter: Payer: Self-pay | Admitting: Family

## 2024-11-16 ENCOUNTER — Encounter: Payer: Self-pay | Admitting: Family

## 2024-11-16 ENCOUNTER — Ambulatory Visit: Admitting: Family

## 2024-11-16 VITALS — BP 128/88 | HR 96 | Ht 62.0 in | Wt 130.2 lb

## 2024-11-16 DIAGNOSIS — Z79899 Other long term (current) drug therapy: Secondary | ICD-10-CM | POA: Diagnosis not present

## 2024-11-16 DIAGNOSIS — G4733 Obstructive sleep apnea (adult) (pediatric): Secondary | ICD-10-CM

## 2024-11-16 DIAGNOSIS — F909 Attention-deficit hyperactivity disorder, unspecified type: Secondary | ICD-10-CM | POA: Diagnosis not present

## 2024-11-16 DIAGNOSIS — E663 Overweight: Secondary | ICD-10-CM

## 2024-11-16 DIAGNOSIS — F902 Attention-deficit hyperactivity disorder, combined type: Secondary | ICD-10-CM

## 2024-11-16 DIAGNOSIS — Z0130 Encounter for examination of blood pressure without abnormal findings: Secondary | ICD-10-CM

## 2024-11-16 MED ORDER — METFORMIN HCL 500 MG PO TABS: 500.0000 mg | ORAL_TABLET | Freq: Every day | ORAL | 1 refills | Status: DC

## 2024-11-16 MED ORDER — AMPHETAMINE-DEXTROAMPHETAMINE 20 MG PO TABS: 30.0000 mg | ORAL_TABLET | Freq: Two times a day (BID) | ORAL | 0 refills | Status: AC

## 2024-11-16 NOTE — Assessment & Plan Note (Signed)
 Continue current meds.  Will adjust as needed based on results.  The patient is asked to make an attempt to improve diet and exercise patterns to aid in medical management of this problem. Addressed importance of increasing and maintaining water  intake.

## 2024-11-16 NOTE — Assessment & Plan Note (Signed)
 Patient stable.  Well controlled with current therapy.   Continue current meds.

## 2024-11-16 NOTE — Progress Notes (Signed)
 "  Established Patient Office Visit  Subjective:  Patient ID: Mikayla Wagner, female    DOB: December 13, 1960  Age: 64 y.o. MRN: [id number]  Chief Complaint  Patient presents with   Follow-up    3 month follow up    Patient is here today for her 3 months follow up.  She has been feeling fairly well since last appointment.   She does have additional concerns to discuss today.  Labs are due today.  She needs refills.   I have reviewed her active problem list, medication list, allergies, notes from last encounter, lab results for her appointment today.      No other concerns at this time.   Past Medical History:  Diagnosis Date   Abnormal mammogram    2013, plan to repeat mammogram 05/2013   Ovarian cyst, complex 2010   s/p removal    Past Surgical History:  Procedure Laterality Date   ABDOMINAL HYSTERECTOMY     h/o two uteruses, two cervixs;Cysts in uterus and outside of uterus. NO UTERINE CANCER; no cervix and no ovaries. Declines pap smear.   APPENDECTOMY     AUGMENTATION MAMMAPLASTY Bilateral 2006   saline   BREAST ENHANCEMENT SURGERY  2006   CESAREAN SECTION     4x   COLON SURGERY     COLONOSCOPY WITH PROPOFOL  N/A 12/10/2016   Procedure: COLONOSCOPY WITH PROPOFOL ;  Surgeon: Reyes LELON Cota, MD;  Location: [facility];  Service: Endoscopy;  Laterality: N/A;   DILATION AND CURETTAGE OF UTERUS  2005   PARTIAL COLECTOMY  2005   ruptured diverticuli, s/p colostomy and reversal   SCAR REVISION  2011    Social History   Socioeconomic History   Marital status: Single    Spouse name: Not on file   Number of children: Not on file   Years of education: Not on file   Highest education level: Not on file  Occupational History   Not on file  Tobacco Use   Smoking status: Never   Smokeless tobacco: Never  Vaping Use   Vaping status: Never Used  Substance and Sexual Activity   Alcohol use: Yes    Comment: socially   Drug use: No   Sexual activity: Not on file   Other Topics Concern   Not on file  Social History Narrative   Works at [employer].   Divorced.   Lives in [place] with 15YO son. Cat. Exercises regularly. Diet regular.   Has 3 daughter and one son. One daughter has CP.       Both parents died in airline crash and she was raised by her older sister.       She lost her parents when she was 8 in plane crash   Social Drivers of Health   Tobacco Use: Low Risk (11/16/2024)   Patient History    Smoking Tobacco Use: Never    Smokeless Tobacco Use: Never    Passive Exposure: Not on file  Financial Resource Strain: Not on file  Food Insecurity: Not on file  Transportation Needs: Not on file  Physical Activity: Not on file  Stress: Not on file  Social Connections: Not on file  Intimate Partner Violence: Not on file  Depression (EYV7-0): Medium Risk (09/11/2023)   Depression (PHQ2-9)    PHQ-2 Score: 7  Alcohol Screen: Not on file  Housing: Not on file  Utilities: Not on file  Health Literacy: Not on file    Family History  Problem Relation Age of  Onset   Thyroid  disease Sister    Cancer Paternal Aunt        ovarian cancer   Breast cancer Neg Hx    Diabetes Neg Hx    Thyroid  cancer Neg Hx     Allergies[1]  Review of Systems  All other systems reviewed and are negative.      Objective:   BP 128/88   Pulse 96   Ht 5' 2 (1.575 m)   Wt 130 lb 3.2 oz (59.1 kg)   SpO2 98%   BMI 23.81 kg/m   Vitals:   11/16/24 1505  BP: 128/88  Pulse: 96  Height: 5' 2 (1.575 m)  Weight: 130 lb 3.2 oz (59.1 kg)  SpO2: 98%  BMI (Calculated): 23.81    Physical Exam Vitals and nursing note reviewed.  Constitutional:      Appearance: Normal appearance. She is normal weight.  HENT:     Head: Normocephalic.  Eyes:     Extraocular Movements: Extraocular movements intact.     Conjunctiva/sclera: Conjunctivae normal.     Pupils: Pupils are equal, round, and reactive to light.  Cardiovascular:     Rate and Rhythm: Normal rate.   Pulmonary:     Effort: Pulmonary effort is normal.  Neurological:     General: No focal deficit present.     Mental Status: She is alert and oriented to person, place, and time. Mental status is at baseline.  Psychiatric:        Mood and Affect: Mood normal.        Behavior: Behavior normal.        Thought Content: Thought content normal.      No results found for any visits on 11/16/24.  No results found for this or any previous visit (from the past 2160 hours).     Assessment & Plan Overweight (BMI 25.0-29.9) Continue current meds.  Will adjust as needed based on results.  The patient is asked to make an attempt to improve diet and exercise patterns to aid in medical management of this problem. Addressed importance of increasing and maintaining water intake.   Obstructive sleep apnea syndrome Sleep study ordered today.  Pt aware they will call to set up for testing at home.   Will await results   Attention deficit hyperactivity disorder (ADHD), combined type Long term current use of therapeutic drug Patient stable.  Well controlled with current therapy.   Continue current meds.      Return in about 3 months (around 02/14/2025).   Total time spent: 20 minutes  ALAN CHRISTELLA ARRANT, FNP  11/16/2024   This document may have been prepared by [facility] Voice Recognition software and as such may include unintentional dictation errors.      [1]  Allergies Allergen Reactions   Codeine Nausea Only   Erythromycin     Nausea and vomiting   Morphine Sulfate Nausea And Vomiting   Latex Rash   "

## 2024-11-21 ENCOUNTER — Encounter: Payer: Self-pay | Admitting: Family

## 2024-11-21 ENCOUNTER — Other Ambulatory Visit: Payer: Self-pay

## 2024-11-21 DIAGNOSIS — K219 Gastro-esophageal reflux disease without esophagitis: Secondary | ICD-10-CM

## 2024-11-21 MED ORDER — FAMOTIDINE 20 MG PO TABS: 20.0000 mg | ORAL_TABLET | Freq: Two times a day (BID) | ORAL | 3 refills | Status: AC

## 2024-11-21 MED ORDER — FAMOTIDINE 20 MG PO TABS: 20.0000 mg | ORAL_TABLET | Freq: Two times a day (BID) | ORAL | 3 refills | Status: DC

## 2024-12-11 ENCOUNTER — Other Ambulatory Visit: Payer: Self-pay | Admitting: Family

## 2025-03-17 ENCOUNTER — Ambulatory Visit: Admitting: Family
